# Patient Record
Sex: Female | Born: 1955 | Race: White | Hispanic: No | Smoking: Former smoker
Health system: Southern US, Community
[De-identification: ages and names within clinical notes are randomized; demographics above are authoritative.]

## PROBLEM LIST (undated history)

## (undated) DIAGNOSIS — Z87442 Personal history of urinary calculi: Secondary | ICD-10-CM

## (undated) DIAGNOSIS — Z9889 Other specified postprocedural states: Secondary | ICD-10-CM

## (undated) DIAGNOSIS — T8859XA Other complications of anesthesia, initial encounter: Secondary | ICD-10-CM

## (undated) DIAGNOSIS — R112 Nausea with vomiting, unspecified: Secondary | ICD-10-CM

## (undated) DIAGNOSIS — E039 Hypothyroidism, unspecified: Secondary | ICD-10-CM

## (undated) DIAGNOSIS — I10 Essential (primary) hypertension: Secondary | ICD-10-CM

## (undated) DIAGNOSIS — E119 Type 2 diabetes mellitus without complications: Secondary | ICD-10-CM

## (undated) DIAGNOSIS — Z8489 Family history of other specified conditions: Secondary | ICD-10-CM

## (undated) DIAGNOSIS — R0600 Dyspnea, unspecified: Secondary | ICD-10-CM

## (undated) HISTORY — PX: BREAST SURGERY: SHX581

## (undated) HISTORY — DX: Type 2 diabetes mellitus without complications: E11.9

## (undated) HISTORY — PX: BACK SURGERY: SHX140

## (undated) HISTORY — PX: EYE SURGERY: SHX253

---

## 1988-10-24 HISTORY — PX: BREAST ENHANCEMENT SURGERY: SHX7

## 2013-10-24 HISTORY — PX: APPENDECTOMY: SHX54

## 2015-10-25 HISTORY — PX: CHOLECYSTECTOMY: SHX55

## 2015-12-09 ENCOUNTER — Encounter: Payer: Self-pay | Admitting: "Endocrinology

## 2015-12-09 ENCOUNTER — Ambulatory Visit (INDEPENDENT_AMBULATORY_CARE_PROVIDER_SITE_OTHER): Payer: Medicare FFS | Admitting: "Endocrinology

## 2015-12-09 VITALS — BP 123/83 | HR 81 | Ht 64.5 in | Wt 164.0 lb

## 2015-12-09 DIAGNOSIS — E108 Type 1 diabetes mellitus with unspecified complications: Secondary | ICD-10-CM | POA: Diagnosis not present

## 2015-12-09 DIAGNOSIS — E1065 Type 1 diabetes mellitus with hyperglycemia: Secondary | ICD-10-CM | POA: Diagnosis not present

## 2015-12-09 DIAGNOSIS — IMO0002 Reserved for concepts with insufficient information to code with codable children: Secondary | ICD-10-CM

## 2015-12-09 DIAGNOSIS — E038 Other specified hypothyroidism: Secondary | ICD-10-CM

## 2015-12-09 DIAGNOSIS — E039 Hypothyroidism, unspecified: Secondary | ICD-10-CM | POA: Insufficient documentation

## 2015-12-09 MED ORDER — LOSARTAN POTASSIUM 25 MG PO TABS: 25.0000 mg | ORAL_TABLET | Freq: Every day | ORAL | Status: AC

## 2015-12-09 MED ORDER — INSULIN ASPART 100 UNIT/ML ~~LOC~~ SOLN: SUBCUTANEOUS | Status: DC

## 2015-12-09 MED ORDER — LEVOTHYROXINE SODIUM 200 MCG PO TABS: 200.0000 ug | ORAL_TABLET | Freq: Every day | ORAL | Status: DC

## 2015-12-09 NOTE — Progress Notes (Signed)
Subjective:    Patient ID: Kristy King, female    DOB: June 30, 1956. Patient is being seen in consultation for management of diabetes requested by  ADDIS,DANIEL, DO  Past Medical History  Diagnosis Date  . Diabetes mellitus, type II [facility])    Past Surgical History  Procedure Laterality Date  . Back surgery     Social History   Social History  . Marital Status: Unknown    Spouse Name: N/A  . Number of Children: N/A  . Years of Education: N/A   Social History Main Topics  . Smoking status: Former Games developer  . Smokeless tobacco: None  . Alcohol Use: No  . Drug Use: No  . Sexual Activity: Not Asked   Other Topics Concern  . None   Social History Narrative  . None   Outpatient Encounter Prescriptions as of 12/09/2015  Medication Sig  . citalopram (CELEXA) 20 MG tablet Take 20 mg by mouth daily.  . insulin aspart (NOVOLOG) 100 UNIT/ML injection Via pump to [place] upto 60 units a day  . levothyroxine (SYNTHROID, LEVOTHROID) 200 MCG tablet Take 1 tablet (200 mcg total) by mouth daily before breakfast.  . losartan (COZAAR) 25 MG tablet Take 1 tablet (25 mg total) by mouth daily.  Marland Kitchen zolpidem (AMBIEN) 5 MG tablet Take 5 mg by mouth at bedtime as needed for sleep.  . [DISCONTINUED] insulin aspart (NOVOLOG) 100 UNIT/ML injection Inject into the skin 3 (three) times daily before meals. Via pump  . [DISCONTINUED] levothyroxine (SYNTHROID, LEVOTHROID) 200 MCG tablet Take 200 mcg by mouth daily before breakfast.  . [DISCONTINUED] losartan (COZAAR) 25 MG tablet Take 25 mg by mouth daily.   No facility-administered encounter medications on file as of 12/09/2015.   ALLERGIES: Allergies  Allergen Reactions  . Sulfa Antibiotics    VACCINATION STATUS:  There is no immunization history on file for this patient.  Diabetes She presents for her initial diabetic visit. She has type 1 diabetes mellitus. Onset time: She was diagnosed at approximate age of 21 years. Her disease course has been  worsening. There are no hypoglycemic associated symptoms. Pertinent negatives for hypoglycemia include no confusion, headaches, pallor or seizures. Associated symptoms include fatigue, polydipsia and polyuria. Pertinent negatives for diabetes include no chest pain and no polyphagia. There are no hypoglycemic complications. Symptoms are worsening. Diabetic complications include retinopathy. Risk factors for coronary artery disease include diabetes mellitus, hypertension, sedentary lifestyle and tobacco exposure. Current diabetic treatment includes insulin pump (She has used insulin pump for "years ", did not bring the meter or log to review her readings. She claims she is monitoring blood glucose 8 times a day.). Compliance with diabetes treatment: Her most recent A1c is 8.2%. She is on NovoLog via insulin pump at complicated basal flow rate. See below. Her weight is fluctuating minimally. She is following a generally unhealthy diet. When asked about meal planning, she reported none. She has not had a previous visit with a dietitian. She participates in exercise intermittently. Her home blood glucose trend is fluctuating dramatically (Basal setting 12:00 =0.5, 4:30 =0.6, 8:00 =0.75, 9:30=0..85, 9:30=0.85, 12:00=1.1, 2:00=0.7, 4;00=0.45, 9;00=0.8 . This gives her total basal insulin of 15.85 units per 24 hours. She boluses for insulin to carb ratio of 10, insulin sensitivity factor 50,.). An ACE inhibitor/angiotensin II receptor blocker is being taken.  Hypertension This is a chronic problem. The current episode started more than 1 year ago. Pertinent negatives include no chest pain, headaches, palpitations or shortness of breath.  Past treatments include angiotensin blockers. Hypertensive end-organ damage includes retinopathy and a thyroid problem.  Thyroid Problem Presents for initial visit. Symptoms include fatigue. Patient reports no cold intolerance, diarrhea, heat intolerance or palpitations. The symptoms  have been stable. Past treatments include levothyroxine (She is currently on levothyroxine 200 g by mouth every morning.). The following procedures have not been performed: thyroid FNA and thyroidectomy.     Review of Systems  Constitutional: Positive for fatigue. Negative for fever, chills and unexpected weight change.  HENT: Negative for trouble swallowing and voice change.   Eyes: Negative for visual disturbance.  Respiratory: Negative for cough, shortness of breath and wheezing.   Cardiovascular: Negative for chest pain, palpitations and leg swelling.  Gastrointestinal: Negative for nausea, vomiting and diarrhea.  Endocrine: Positive for polydipsia and polyuria. Negative for cold intolerance, heat intolerance and polyphagia.  Musculoskeletal: Negative for myalgias and arthralgias.  Skin: Negative for color change, pallor, rash and wound.  Neurological: Negative for seizures and headaches.  Psychiatric/Behavioral: Negative for suicidal ideas and confusion.    Objective:    BP 123/83 mmHg  Pulse 81  Ht 5' 4.5" (1.638 m)  Wt 164 lb (74.39 kg)  BMI 27.73 kg/m2  SpO2 99%  Wt Readings from Last 3 Encounters:  12/09/15 164 lb (74.39 kg)    Physical Exam  Constitutional: She is oriented to person, place, and time. She appears well-developed.  HENT:  Head: Normocephalic and atraumatic.  Eyes: EOM are normal.  Neck: Normal range of motion. Neck supple. No tracheal deviation present. No thyromegaly present.  Cardiovascular: Normal rate and regular rhythm.   Pulmonary/Chest: Effort normal and breath sounds normal.  Abdominal: Soft. Bowel sounds are normal. There is no tenderness. There is no guarding.  Musculoskeletal: Normal range of motion. She exhibits no edema.  Neurological: She is alert and oriented to person, place, and time. She has normal reflexes. No cranial nerve deficit. Coordination normal.  Skin: Skin is warm and dry. No rash noted. No erythema. No pallor.   Psychiatric: She has a normal mood and affect. Judgment normal.  Defensive, suspicious affect.    Assessment & Plan:   1. Uncontrolled type 1 diabetes mellitus with complication [facility])   - Patient has currently uncontrolled symptomatic type 1 DM since  60 years of age,  with most recent A1c of 8.2% from September 2016.  Nor recent labs to review. She states she has a copy of her recent labs and she will bring it next visit with her.  -Despite her history of type 1 diabetes for 37 years, she denies any history of DKA. -Review of her insulin pump sitting suggests she may have a component of insulin resistance. This would warrant further workup to classify her diabetes properly. This will be done once she approaches target A1c of 7%.   Her diabetes is complicated by retinopathy and patient remains at a high risk for more acute and chronic complications of diabetes which include CAD, CVA, CKD, retinopathy, and neuropathy. These are all discussed in detail with the patient.  - I have counseled the patient on diet management  by adopting a carbohydrate modified/protein rich diet.  - Suggestion is made for patient to avoid simple carbohydrates   from their diet including Cakes , Desserts, Ice Cream,  Soda (  diet and regular) , Sweet Tea , Candies,  Chips, Cookies, Artificial Sweeteners,   and "Sugar-free" Products . This will help patient to have stable blood glucose profile and potentially avoid unintended  weight gain.  - I encouraged the patient to switch to  unprocessed or minimally processed complex starch and increased protein intake (animal or plant source), fruits, and vegetables.  - Patient is advised to stick to a routine mealtimes to eat 3 meals  a day and avoid unnecessary snacks ( to snack only to correct hypoglycemia).  - The patient will be scheduled with Norm Salt, RDN, CDE for individualized DM education.  - I have approached patient with the following individualized plan  to manage diabetes and patient agrees:   - I  have revised and edited her basal insulin flow rate as follows : 12 AM = 0.5, 8 AM =0.75, 12:30 PM =0.8, and 6:30 PM =0.6    -this will give her basal insulin of 15.5 units in 24 hours.  -Her bolus stating is allowed to run the same with insulin to carb ratio of 10, insulin sensitivity factor of 50, target between 100 140, and active insulin time 3 hours.  -Based on her current body weight her total daily requirement of insulin is calculated to be approximately 36 units. -She is advised to start  strict monitoring of glucose  AC and HS. - Patient is warned not to take insulin without proper monitoring per orders.  -Patient is encouraged to call clinic for blood glucose levels less than 70 or above 300 mg /dl.  -Patient is not a candidate for metformin,SGLT2 inhibitors, nor  incretin therapy .  - Patient specific target  A1c;  LDL, HDL, Triglycerides, and  Waist Circumference were discussed in detail.  2) BP/HTN:  controlled. Continue current medications including ACEI/ARB. 3) Lipids/HPL:   Controlled unknown, she is not on statins. 4)  Weight/Diet: CDE Consult will be initiated , exercise, and detailed carbohydrates information provided.  5. hypothyroidism - She appears clinically euthyroid. I will review her lab work which she states she had recently and would make adjustment in her thyroid hormone doses if necessary.  - We discussed about correct intake of levothyroxine, at fasting, with water, separated by at least 30 minutes from breakfast, and separated by more than 4 hours from calcium, iron, multivitamins, acid reflux medications (PPIs). -Patient is made aware of the fact that thyroid hormone replacement is needed for life, dose to be adjusted by periodic monitoring of thyroid function tests.  6) Chronic Care/Health Maintenance:  -Patient is on ARB and encouraged to continue to follow up with Ophthalmology, Podiatrist at least yearly or  according to recommendations, and advised to   stay away from smoking. I have recommended yearly flu vaccine and pneumonia vaccination at least every 5 years; moderate intensity exercise for up to 150 minutes weekly; and  sleep for at least 7 hours a day.  - 60 minutes of time was spent on the care of this patient , 50% of which was applied for counseling on diabetes complications and their preventions.  - Patient to bring meter and  blood glucose logs during their next visit.   - I advised patient to maintain close follow up with ADDIS,DANIEL, DO for primary care needs.  Follow up plan: - Return in about 1 week (around 12/16/2015) for diabetes, underactive thyroid, high blood pressure, follow up with meter and logs- no labs.  Marquis Lunch, MD Phone: [phone]  Fax: [phone]   12/09/2015, 3:23 PM

## 2015-12-17 ENCOUNTER — Ambulatory Visit: Payer: Medicare FFS | Admitting: "Endocrinology

## 2015-12-25 ENCOUNTER — Ambulatory Visit: Payer: Self-pay | Admitting: Nutrition

## 2016-01-26 ENCOUNTER — Other Ambulatory Visit: Payer: Self-pay | Admitting: "Endocrinology

## 2016-02-29 ENCOUNTER — Other Ambulatory Visit: Payer: Self-pay

## 2016-02-29 MED ORDER — INSULIN ASPART 100 UNIT/ML ~~LOC~~ SOLN: SUBCUTANEOUS | Status: DC

## 2021-06-21 ENCOUNTER — Encounter (HOSPITAL_BASED_OUTPATIENT_CLINIC_OR_DEPARTMENT_OTHER): Payer: Self-pay | Admitting: Urology

## 2021-06-21 ENCOUNTER — Other Ambulatory Visit: Payer: Self-pay | Admitting: Urology

## 2021-06-21 DIAGNOSIS — N200 Calculus of kidney: Secondary | ICD-10-CM

## 2021-06-21 NOTE — Progress Notes (Signed)
Patient to arrive at 0800 on 06/24/2021. History and medications reviewed. Pre-procedure instructions given. Patient has an insulin pump and will set at basal rate as instructed. NPO after MN on Wednesday except for clear liquids until 0600. Has stopped naproxen on 06/21/2021. Will take losartan AM of procedure. Driver secured.

## 2021-06-23 NOTE — H&P (Signed)
  H&P  Chief Complaint: Lt kidney stone  History of Present Illness: Kristy King is a 65 y.o. year old female presenting for ESL of a 7 mm Lt renal pelvic stone  Past Medical History:  Diagnosis Date   Diabetes mellitus, type II (HCC)    Dyspnea    History of kidney stones    Hypertension    Hypothyroidism       Home Medications:  No medications prior to admission.    Allergies:  Allergies  Allergen Reactions   Sulfa Antibiotics     No family history on file.  Social History:  reports that she has quit smoking. She does not have any smokeless tobacco history on file. She reports that she does not drink alcohol and does not use drugs.  ROS: A complete review of systems was performed.  All systems are negative except for pertinent findings as noted.  Physical Exam:  Vital signs in last 24 hours: BP: ()/()  Arterial Line BP: ()/()  General:  Alert and oriented, No acute distress HEENT: Normocephalic, atraumatic Neck: No JVD or lymphadenopathy Cardiovascular: Regular rate  Lungs: Normal inspiratory/expiratory excursion Extremities: No edema Neurologic: Grossly intact  CT/plain film images reviewed  Impression/Assessment:  Lt renal stone  Plan:  ESL  Bertram Millard Elinora Weigand 06/23/2021, 7:56 PM  Bertram Millard. Nishtha Raider MD

## 2021-06-24 ENCOUNTER — Encounter (HOSPITAL_BASED_OUTPATIENT_CLINIC_OR_DEPARTMENT_OTHER)
Admission: RE | Disposition: A | Discharge status: Home / Self Care | Payer: Self-pay | Source: Home / Self Care | Attending: Urology

## 2021-06-24 ENCOUNTER — Ambulatory Visit (HOSPITAL_BASED_OUTPATIENT_CLINIC_OR_DEPARTMENT_OTHER)
Admission: RE | Admit: 2021-06-24 | Discharge: 2021-06-24 | Disposition: A | Discharge status: Home / Self Care | Payer: Medicare PPO | Attending: Urology | Admitting: Urology

## 2021-06-24 ENCOUNTER — Other Ambulatory Visit: Payer: Self-pay

## 2021-06-24 ENCOUNTER — Ambulatory Visit (HOSPITAL_COMMUNITY): Payer: Medicare PPO

## 2021-06-24 ENCOUNTER — Encounter (HOSPITAL_BASED_OUTPATIENT_CLINIC_OR_DEPARTMENT_OTHER): Payer: Self-pay | Admitting: Urology

## 2021-06-24 DIAGNOSIS — Z87891 Personal history of nicotine dependence: Secondary | ICD-10-CM | POA: Insufficient documentation

## 2021-06-24 DIAGNOSIS — I10 Essential (primary) hypertension: Secondary | ICD-10-CM | POA: Diagnosis not present

## 2021-06-24 DIAGNOSIS — N200 Calculus of kidney: Secondary | ICD-10-CM | POA: Diagnosis not present

## 2021-06-24 DIAGNOSIS — Z882 Allergy status to sulfonamides status: Secondary | ICD-10-CM | POA: Diagnosis not present

## 2021-06-24 DIAGNOSIS — E119 Type 2 diabetes mellitus without complications: Secondary | ICD-10-CM | POA: Diagnosis not present

## 2021-06-24 DIAGNOSIS — E039 Hypothyroidism, unspecified: Secondary | ICD-10-CM | POA: Diagnosis not present

## 2021-06-24 HISTORY — DX: Hypothyroidism, unspecified: E03.9

## 2021-06-24 HISTORY — DX: Dyspnea, unspecified: R06.00

## 2021-06-24 HISTORY — DX: Other specified postprocedural states: Z98.890

## 2021-06-24 HISTORY — DX: Personal history of urinary calculi: Z87.442

## 2021-06-24 HISTORY — PX: EXTRACORPOREAL SHOCK WAVE LITHOTRIPSY: SHX1557

## 2021-06-24 HISTORY — DX: Other complications of anesthesia, initial encounter: T88.59XA

## 2021-06-24 HISTORY — DX: Family history of other specified conditions: Z84.89

## 2021-06-24 HISTORY — DX: Essential (primary) hypertension: I10

## 2021-06-24 HISTORY — DX: Nausea with vomiting, unspecified: R11.2

## 2021-06-24 LAB — GLUCOSE, CAPILLARY
Glucose-Capillary: 247 mg/dL — ABNORMAL HIGH (ref 70–99)
Glucose-Capillary: 182 mg/dL — ABNORMAL HIGH (ref 70–99)

## 2021-06-24 SURGERY — LITHOTRIPSY, ESWL
Anesthesia: LOCAL | Laterality: Left

## 2021-06-24 MED ORDER — KETOROLAC TROMETHAMINE 10 MG PO TABS: 10.0000 mg | ORAL_TABLET | Freq: Four times a day (QID) | ORAL | 0 refills | Status: DC | PRN

## 2021-06-24 MED ORDER — CIPROFLOXACIN HCL 500 MG PO TABS
ORAL_TABLET | ORAL | Status: AC
  Filled 2021-06-24: qty 1

## 2021-06-24 MED ORDER — DIPHENHYDRAMINE HCL 25 MG PO CAPS
25.0000 mg | ORAL_CAPSULE | ORAL | Status: AC
  Administered 2021-06-24: 25 mg via ORAL

## 2021-06-24 MED ORDER — DIAZEPAM 5 MG PO TABS
10.0000 mg | ORAL_TABLET | ORAL | Status: AC
  Administered 2021-06-24: 10 mg via ORAL

## 2021-06-24 MED ORDER — CIPROFLOXACIN HCL 500 MG PO TABS
500.0000 mg | ORAL_TABLET | ORAL | Status: AC
  Administered 2021-06-24: 500 mg via ORAL

## 2021-06-24 MED ORDER — DIAZEPAM 5 MG PO TABS
ORAL_TABLET | ORAL | Status: AC
  Filled 2021-06-24: qty 2

## 2021-06-24 MED ORDER — DIPHENHYDRAMINE HCL 25 MG PO CAPS
ORAL_CAPSULE | ORAL | Status: AC
  Filled 2021-06-24: qty 1

## 2021-06-24 MED ORDER — KETOROLAC TROMETHAMINE 60 MG/2ML IM SOLN: 15.0000 mg | Freq: Once | INTRAMUSCULAR | Status: AC

## 2021-06-24 MED ORDER — SODIUM CHLORIDE 0.9 % IV SOLN: INTRAVENOUS | Status: DC

## 2021-06-24 NOTE — Op Note (Signed)
See [facility] OP note scanned into chart. 

## 2021-06-24 NOTE — Discharge Instructions (Addendum)
See [facility] discharge instructions in chart.  

## 2021-06-24 NOTE — Interval H&P Note (Signed)
History and Physical Interval Note:  06/24/2021 9:52 AM  Kristy King  has presented today for surgery, with the diagnosis of LEFT RENAL STONE.  The various methods of treatment have been discussed with the patient and family. After consideration of risks, benefits and other options for treatment, the patient has consented to  Procedure(s): EXTRACORPOREAL SHOCK WAVE LITHOTRIPSY (ESWL) (Left) as a surgical intervention.  The patient's history has been reviewed, patient examined, no change in status, stable for surgery.  I have reviewed the patient's chart and labs.  Questions were answered to the patient's satisfaction.     Bertram Millard Redell Nazir

## 2021-06-25 ENCOUNTER — Encounter (HOSPITAL_BASED_OUTPATIENT_CLINIC_OR_DEPARTMENT_OTHER): Payer: Self-pay | Admitting: Urology

## 2021-11-29 ENCOUNTER — Encounter: Payer: Self-pay | Admitting: Endocrinology

## 2021-11-29 ENCOUNTER — Other Ambulatory Visit: Payer: Self-pay

## 2021-11-29 ENCOUNTER — Ambulatory Visit (INDEPENDENT_AMBULATORY_CARE_PROVIDER_SITE_OTHER): Payer: Medicare PPO | Admitting: Endocrinology

## 2021-11-29 VITALS — BP 150/74 | HR 76 | Ht 65.0 in | Wt 204.0 lb

## 2021-11-29 DIAGNOSIS — E10319 Type 1 diabetes mellitus with unspecified diabetic retinopathy without macular edema: Secondary | ICD-10-CM | POA: Diagnosis not present

## 2021-11-29 DIAGNOSIS — E109 Type 1 diabetes mellitus without complications: Secondary | ICD-10-CM

## 2021-11-29 LAB — POCT GLYCOSYLATED HEMOGLOBIN (HGB A1C): Hemoglobin A1C: 7.5 % — AB (ref 4.0–5.6)

## 2021-11-29 NOTE — Patient Instructions (Addendum)
good diet and exercise significantly improve the control of your diabetes.  please let me know if you wish to be referred to a dietician.  high blood sugar is very risky to your health.  you should see an eye doctor and dentist every year.  It is very important to get all recommended vaccinations.  Controlling your blood pressure and cholesterol drastically reduces the damage diabetes does to your body.  Those who smoke should quit.  Please discuss these with your doctor.  check your blood sugar 4 times a day: before the 3 meals, and at bedtime.  also check if you have symptoms of your blood sugar being too high or too low.  please keep a record of the readings and bring it to your next appointment here (or you can bring the meter itself).  You can write it on any piece of paper.  please call us sooner if your blood sugar goes below 70, or if most of your readings are over 200.   We will need to take this complex situation in stages. For now, please do not take any boluses after supper.   We are placing a continuous glucose monitor sensor today.   Please come back for a follow-up appointment in 2 weeks Please see a pump trainer the same day, to consider a new pump and continuous glucose monitor.

## 2021-11-29 NOTE — Progress Notes (Signed)
Subjective:    Patient ID: Kristy King, female    DOB: 07-08-1956, 66 y.o.   MRN: [id number]  HPI pt is referred by Dr Marveen Reeks, for diabetes.  Pt states DM was dx'ed in [id number]; it is complicated by DR; she has been on insulin since dx; pt says her diet and exercise are not good; she has never had pancreatitis, pancreatic surgery, severe hypoglycemia.  She has had DKA once (2018).  She has mild hypoglycemia every night, 2200-0230.  She eats meals at 10AM, 1PM, and 5PM.  She takes basal rates varying from 0-1.75  units/hr.  Bolus is 1 unit/10 grams CHO, and ISR of 1 unit/50 over 120.  Total daily dosage is 44 units (54% basal).  She takes 5.5 boluses per day (often after supper).  She has never used continuous glucose monitor Past Medical History:  Diagnosis Date   Complication of anesthesia    Diabetes mellitus, type II ([facility])    Dyspnea    Family history of adverse reaction to anesthesia    History of kidney stones    Hypertension    Hypothyroidism    PONV (postoperative nausea and vomiting)     Past Surgical History:  Procedure Laterality Date   APPENDECTOMY N/A 2015   BACK SURGERY     BREAST ENHANCEMENT [place]   CHOLECYSTECTOMY N/A 2017   EXTRACORPOREAL SHOCK WAVE LITHOTRIPSY Left 06/24/2021   Procedure: EXTRACORPOREAL SHOCK WAVE LITHOTRIPSY (ESWL);  Surgeon: Franchot Gallo, MD;  Location: [facility];  Service: Urology;  Laterality: Left;   EYE SURGERY      Social History   Socioeconomic History   Marital status: Unknown    Spouse name: Not on file   Number of children: Not on file   Years of education: Not on file   Highest education level: Not on file  Occupational History   Not on file  Tobacco Use   Smoking status: Former   Smokeless tobacco: Never  Substance and Sexual Activity   Alcohol use: No    Alcohol/week: 0.0 standard drinks   Drug use: No   Sexual activity: Not on file  Other Topics  Concern   Not on file  Social History Narrative   Not on file   Social Determinants of Health   Financial Resource Strain: Not on file  Food Insecurity: Not on file  Transportation Needs: Not on file  Physical Activity: Not on file  Stress: Not on file  Social Connections: Not on file  Intimate Partner Violence: Not on file      Allergies  Allergen Reactions   Sulfa Antibiotics     Family History  Problem Relation Age of Onset   Diabetes Neg Hx     BP (!) 150/74    Pulse 76    Ht 5\' 5"  (1.651 m)    Wt 204 lb (92.5 kg)    SpO2 97%    BMI 33.95 kg/m     Review of Systems denies sob, n/v, memory loss.  She has gained 40 lbs x 3 years.     Objective:   Physical Exam VITAL SIGNS:  See vs page GENERAL: no distress Pulses: dorsalis pedis intact bilat.   MSK: no deformity of the feet CV: no leg edema Skin:  no ulcer on the feet.  normal color and temp on the feet. Neuro: sensation is intact to touch on the feet Ext:  there is bilateral onychomycosis of the toenails.    I have reviewed outside records, and summarized: Pt was noted to have elevated A1c, and referred here.  She was noted to have widely varying glucose and frequent hypoglycemia   A1c=7.5%    Assessment & Plan:  Type 1 DM: uncontrolled Hypoglycemia, due to insulin.  We discussed causes of this in the evening.  Patient Instructions  good diet and exercise significantly improve the control of your diabetes.  please let me know if you wish to be referred to a dietician.  high blood sugar is very risky to your health.  you should see an eye doctor and dentist every year.  It is very important to get all recommended vaccinations.  Controlling your blood pressure and cholesterol drastically reduces the damage diabetes does to your body.  Those who smoke should quit.  Please discuss these with your doctor.  check your blood sugar 4 times a day: before the 3 meals, and at bedtime.  also check if you have  symptoms of your blood sugar being too high or too low.  please keep a record of the readings and bring it to your next appointment here (or you can bring the meter itself).  You can write it on any piece of paper.  please call us sooner if your blood sugar goes below 70, or if most of your readings are over 200.   We will need to take this complex situation in stages. For now, please do not take any boluses after supper.   We are placing a continuous glucose monitor sensor today.   Please come back for a follow-up appointment in 2 weeks Please see a pump trainer the same day, to consider a new pump and continuous glucose monitor.

## 2021-11-30 ENCOUNTER — Telehealth: Payer: Self-pay

## 2021-11-30 ENCOUNTER — Other Ambulatory Visit: Payer: Self-pay | Admitting: Endocrinology

## 2021-11-30 MED ORDER — INSULIN ASPART 100 UNIT/ML IJ SOLN: INTRAMUSCULAR | 3 refills | Status: DC

## 2021-11-30 NOTE — Telephone Encounter (Signed)
Pt Lvm and wanted to know f you sent her insulin in to [facility] in [place]. I did not see anything in the medication list that was sent in.  Please Advise

## 2021-12-01 DIAGNOSIS — E119 Type 2 diabetes mellitus without complications: Secondary | ICD-10-CM | POA: Insufficient documentation

## 2021-12-14 ENCOUNTER — Ambulatory Visit: Payer: Medicare PPO | Admitting: Endocrinology

## 2021-12-14 ENCOUNTER — Other Ambulatory Visit: Payer: Self-pay

## 2021-12-14 VITALS — BP 140/70 | HR 83 | Ht 65.0 in | Wt 201.6 lb

## 2021-12-14 DIAGNOSIS — E10319 Type 1 diabetes mellitus with unspecified diabetic retinopathy without macular edema: Secondary | ICD-10-CM | POA: Diagnosis not present

## 2021-12-14 MED ORDER — FREESTYLE LIBRE 2 SENSOR MISC: 1.0000 | 3 refills | Status: DC

## 2021-12-14 NOTE — Patient Instructions (Addendum)
check your blood sugar 4 times a day: before the 3 meals, and at bedtime.  also check if you have symptoms of your blood sugar being too high or too low.  please keep a record of the readings and bring it to your next appointment here (or you can bring the meter itself).  You can write it on any piece of paper.  please call us sooner if your blood sugar goes below 70, or if most of your readings are over 200.   We will need to take this complex situation in stages.  Please take these settings:  basal rate: 1AM 0.5 units/HR 6AM 1 unit/HR 9PM 0.05 units/HR.  bolus of 1 unit/10 grams carbohydrate.   correction bolus (which some people call "sensitivity," or "insulin sensitivity ratio," or just "isr") of 1 unit for each 50 by which your glucose exceeds 120.   Please come back for a follow-up appointment in 1 month.

## 2021-12-14 NOTE — Progress Notes (Signed)
Subjective:    Patient ID: Kristy King, female    DOB: 12-26-1955, 66 y.o.   MRN: [id number]  HPI Pt returns for f/u of diabetes mellitus: DM type: 1 Dx'ed: 1979 Complications: DR Therapy: insulin since dx GDM: never DKA: 2018 Severe hypoglycemia: never Pancreatitis: never Pancreatic imaging: none known SDOH: none Other: she uses pump and continuous glucose monitor Interval history: She eats meals at 9AM, 1PM, and 6PM.  She also eats later in the evening.  She takes these settings:  Multiple basal rates, varying from 0-1.75 units/hr bolus of 1 unit/10 grams carbohydrate correction bolus (which some people call "sensitivity," or "insulin sensitivity ratio," or just "isr") of 1 unit for each 50 by which your glucose exceeds 120.  She has mild hypoglycemia approx BIW I reviewed continuous glucose monitor data.  Glucose varies from 60-400.  It is in general highest at 6AM-10AM, then it steadily decreases to 1AM.  Then it re-increases Past Medical History:  Diagnosis Date   Complication of anesthesia    Diabetes mellitus, type II (HCC)    Dyspnea    Family history of adverse reaction to anesthesia    History of kidney stones    Hypertension    Hypothyroidism    PONV (postoperative nausea and vomiting)     Past Surgical History:  Procedure Laterality Date   APPENDECTOMY N/A 2015   BACK SURGERY     BREAST ENHANCEMENT SURGERY  1990   BREAST SURGERY     CESAREAN SECTION  1982   CHOLECYSTECTOMY N/A 2017   EXTRACORPOREAL SHOCK WAVE LITHOTRIPSY Left 06/24/2021   Procedure: EXTRACORPOREAL SHOCK WAVE LITHOTRIPSY (ESWL);  Surgeon: Marcine Matar, MD;  Location: [facility];  Service: Urology;  Laterality: Left;   EYE SURGERY      Social History   Socioeconomic History   Marital status: Unknown    Spouse name: Not on file   Number of children: Not on file   Years of education: Not on file   Highest education level: Not on file  Occupational History   Not on  file  Tobacco Use   Smoking status: Former   Smokeless tobacco: Never  Substance and Sexual Activity   Alcohol use: No    Alcohol/week: 0.0 standard drinks   Drug use: No   Sexual activity: Not on file  Other Topics Concern   Not on file  Social History Narrative   Not on file   Social Determinants of Health   Financial Resource Strain: Not on file  Food Insecurity: Not on file  Transportation Needs: Not on file  Physical Activity: Not on file  Stress: Not on file  Social Connections: Not on file  Intimate Partner Violence: Not on file      Allergies  Allergen Reactions   Sulfa Antibiotics     Family History  Problem Relation Age of Onset   Diabetes Neg Hx     BP 140/70    Pulse 83    Ht 5\' 5"  (1.651 m)    Wt 201 lb 9.6 oz (91.4 kg)    SpO2 97%    BMI 33.55 kg/m   Review of Systems     Objective:   Physical Exam    Lab Results  Component Value Date   HGBA1C 7.5 (A) 11/29/2021      Assessment & Plan:  Type 1 DM. Hypoglycemia, due to insulin: this limits aggressiveness of glycemic control  Patient Instructions  check your blood sugar 4 times  a day: before the 3 meals, and at bedtime.  also check if you have symptoms of your blood sugar being too high or too low.  please keep a record of the readings and bring it to your next appointment here (or you can bring the meter itself).  You can write it on any piece of paper.  please call us sooner if your blood sugar goes below 70, or if most of your readings are over 200.   We will need to take this complex situation in stages.  Please take these settings:  basal rate: 1AM 0.5 units/HR 6AM 1 unit/HR 9PM 0.05 units/HR.  bolus of 1 unit/10 grams carbohydrate.   correction bolus (which some people call "sensitivity," or "insulin sensitivity ratio," or just "isr") of 1 unit for each 50 by which your glucose exceeds 120.   Please come back for a follow-up appointment in 1 month.

## 2021-12-17 ENCOUNTER — Encounter: Payer: Self-pay | Admitting: Endocrinology

## 2021-12-28 ENCOUNTER — Telehealth: Payer: Self-pay | Admitting: Nutrition

## 2021-12-28 NOTE — Telephone Encounter (Signed)
Message left on my machine to call her with help with CGM.  LVM to call me to discuss cgms and training for this. ?

## 2022-01-13 ENCOUNTER — Ambulatory Visit: Payer: Medicare PPO | Admitting: Endocrinology

## 2022-02-02 ENCOUNTER — Telehealth: Payer: Self-pay | Admitting: Nutrition

## 2022-02-02 NOTE — Telephone Encounter (Signed)
Patient reports that she still has not gotten her pump supplies from [facility].  She called them today, and they told her they requested last office notes, but only received the fax cover sheet.  She has only 5 days left of her pump supplies ?This note sent to Amieya ?

## 2022-02-02 NOTE — Telephone Encounter (Signed)
Patient says she needs another appointment in one month with a doctor in order to continue getting her pump supplies.  She is a patient of Dr. Loanne Drilling.  Please call her to work her into someone's schedule, so she can get her insulin supplies. Wayman Hoard at the front desk says she can not make an appointment for him.  Not sure why.  Please call patient.  She is getting very anxious ?

## 2022-02-07 ENCOUNTER — Telehealth: Payer: Self-pay

## 2022-02-07 ENCOUNTER — Encounter: Payer: Medicare PPO | Attending: Endocrinology | Admitting: Nutrition

## 2022-02-07 DIAGNOSIS — E109 Type 1 diabetes mellitus without complications: Secondary | ICD-10-CM | POA: Diagnosis present

## 2022-02-07 DIAGNOSIS — Z713 Dietary counseling and surveillance: Secondary | ICD-10-CM | POA: Diagnosis not present

## 2022-02-07 DIAGNOSIS — E10319 Type 1 diabetes mellitus with unspecified diabetic retinopathy without macular edema: Secondary | ICD-10-CM

## 2022-02-07 NOTE — Patient Instructions (Addendum)
Change sensor every 14 days ?Read over manual and call help line if questions or if sensor falls off. ?

## 2022-02-07 NOTE — Telephone Encounter (Signed)
Physician order has now been faxed over to [facility].  ? ?Faxed to: [phone] ?

## 2022-02-07 NOTE — Progress Notes (Signed)
Patient reports that she still does not have her pump supplies.  She again called [facility], and they said the form was not filled out completes, missing the number of infusion sets needed.  Kristy King was notified and she was shown the form and what needed to be checked off.  This was faxed, and patient was notifed.  ?She was trained on how to use the [place] 2.  Her phone could not download the app, so readings are going to her reader.  This was set with date/time and she was show how to attach the sensor and start it using the reader.  She reported good understanding of this and had no final questions. ?Pt. Very upset that she will need an appointment with an endocrinologist in 2 months, in order to get her pump supplies.  There are no appointments remaining with Dr. Everardo All, and she does not know what to do. Pt. Was told that I will contact our office manager about this,  and she will be notified of this situation. ?EMail sent to Synetta Shadow ?Box of pump infusion sets given to patient to help with above situation ?

## 2022-02-09 ENCOUNTER — Encounter: Payer: Self-pay | Admitting: Endocrinology

## 2022-02-09 ENCOUNTER — Ambulatory Visit: Payer: Medicare PPO | Admitting: Endocrinology

## 2022-02-09 VITALS — BP 130/80 | HR 81 | Ht 65.0 in | Wt 204.2 lb

## 2022-02-09 DIAGNOSIS — E10319 Type 1 diabetes mellitus with unspecified diabetic retinopathy without macular edema: Secondary | ICD-10-CM

## 2022-02-09 DIAGNOSIS — E1065 Type 1 diabetes mellitus with hyperglycemia: Secondary | ICD-10-CM

## 2022-02-09 LAB — POCT GLYCOSYLATED HEMOGLOBIN (HGB A1C): Hemoglobin A1C: 8.1 % — AB (ref 4.0–5.6)

## 2022-02-09 NOTE — Telephone Encounter (Signed)
Opened in error

## 2022-02-09 NOTE — Patient Instructions (Addendum)
check your blood sugar 4 times a day: before the 3 meals, and at bedtime.  also check if you have symptoms of your blood sugar being too high or too low.  please keep a record of the readings and bring it to your next appointment here (or you can bring the meter itself).  You can write it on any piece of paper.  please call us sooner if your blood sugar goes below 70, or if most of your readings are over 200.   ?We will need to take this complex situation in stages.   ?Please take these settings:  ?Same 6 basal rates ?bolus of 1 unit/10 grams carbohydrate.   ?correction bolus (which some people call "sensitivity," or "insulin sensitivity ratio," or just "isr") of 1 unit for each 50 by which your glucose exceeds 120.   ?You should have an endocrinology follow-up appointment in 2-3 months.   ? ?

## 2022-02-09 NOTE — Progress Notes (Signed)
? ?Subjective:  ? ? Patient ID: Kristy King, female    DOB: [birth year], 66 y.o.   MRN: [id number] ? ?HPI ?Pt returns for f/u of diabetes mellitus: ?DM type: 1 ?Dx'ed: 1979 ?Complications: DR ?Therapy: insulin since dx ?GDM: never ?DKA: 2018 ?Severe hypoglycemia: never ?Pancreatitis: never ?Pancreatic imaging: none known ?SDOH: none ?Other: she uses pump and continuous glucose monitor ?Interval history: She eats meals at 9AM, 1PM, and 6PM.  She also eats later in the evening.  She takes these settings:  ?6 basal rates, varying from 0.025-2 units/hr.   ?bolus of 1 unit/10 grams carbohydrate ?correction bolus (which some people call "sensitivity," or "insulin sensitivity ratio," or just "isr") of 1 unit for each 50 by which your glucose exceeds 120.  She has mild hypoglycemia approx BIW ?I reviewed continuous glucose monitor printout, but data are minimal.  TDD is 42 units (51% basal) ?She has been using [place] CGM 3 days ago.  Since then, no hypoglycemia.  She does not want to reduce number of basal rates.   ?Past Medical History:  ?Diagnosis Date  ? Complication of anesthesia   ? Diabetes mellitus, type II (HCC)   ? Dyspnea   ? Family history of adverse reaction to anesthesia   ? History of kidney stones   ? Hypertension   ? Hypothyroidism   ? PONV (postoperative nausea and vomiting)   ? ? ?Past Surgical History:  ?Procedure Laterality Date  ? APPENDECTOMY N/A 2015  ? BACK SURGERY    ? BREAST ENHANCEMENT SURGERY  1990  ? BREAST SURGERY    ? CESAREAN SECTION  1982  ? CHOLECYSTECTOMY N/A 2017  ? EXTRACORPOREAL SHOCK WAVE LITHOTRIPSY Left 06/24/2021  ? Procedure: EXTRACORPOREAL SHOCK WAVE LITHOTRIPSY (ESWL);  Surgeon: Marcine Matar, MD;  Location[facility];  Service: Urology;  Laterality: Left;  ? EYE SURGERY    ? ? ?Social History  ? ?Socioeconomic History  ? Marital status: Unknown  ?  Spouse name: Not on file  ? Number of children: Not on file  ? Years of education: Not on file  ? Highest education  level: Not on file  ?Occupational History  ? Not on file  ?Tobacco Use  ? Smoking status: Former  ? Smokeless tobacco: Never  ?Substance and Sexual Activity  ? Alcohol use: No  ?  Alcohol/week: 0.0 standard drinks  ? Drug use: No  ? Sexual activity: Not on file  ?Other Topics Concern  ? Not on file  ?Social History Narrative  ? Not on file  ? ?Social Determinants of Health  ? ?Financial Resource Strain: Not on file  ?Food Insecurity: Not on file  ?Transportation Needs: Not on file  ?Physical Activity: Not on file  ?Stress: Not on file  ?Social Connections: Not on file  ?Intimate Partner Violence: Not on file  ? ? ? ? ?Allergies  ?Allergen Reactions  ? Sulfa Antibiotics   ? ? ?Family History  ?Problem Relation Age of Onset  ? Diabetes Neg Hx   ? ? ?BP 130/80 (BP Location: Right Arm, Patient Position: Sitting, Cuff Size: Normal)   Pulse 81   Ht 5\' 5"  (1.651 m)   Wt 204 lb 3.2 oz (92.6 kg)   SpO2 95%   BMI 33.98 kg/m?  ? ? ?Review of Systems ? ?   ?Objective:  ? Physical Exam ?VITAL SIGNS:  See vs page ?GENERAL: no distress ? ? ? ?Lab Results  ?Component Value Date  ? HGBA1C 8.1 (A) 02/09/2022  ? ?   ?  Assessment & Plan:  ?Type 1 DM: uncontrolled ? ? ?Patient Instructions  ?check your blood sugar 4 times a day: before the 3 meals, and at bedtime.  also check if you have symptoms of your blood sugar being too high or too low.  please keep a record of the readings and bring it to your next appointment here (or you can bring the meter itself).  You can write it on any piece of paper.  please call us sooner if your blood sugar goes below 70, or if most of your readings are over 200.   ?We will need to take this complex situation in stages.   ?Please take these settings:  ?Same 6 basal rates ?bolus of 1 unit/10 grams carbohydrate.   ?correction bolus (which some people call "sensitivity," or "insulin sensitivity ratio," or just "isr") of 1 unit for each 50 by which your glucose exceeds 120.   ?You should have an  endocrinology follow-up appointment in 2-3 months.   ? ? ? ?

## 2022-05-09 ENCOUNTER — Encounter: Payer: Medicare PPO | Attending: Internal Medicine | Admitting: Nutrition

## 2022-05-09 DIAGNOSIS — E10311 Type 1 diabetes mellitus with unspecified diabetic retinopathy with macular edema: Secondary | ICD-10-CM | POA: Diagnosis not present

## 2022-05-09 DIAGNOSIS — Z713 Dietary counseling and surveillance: Secondary | ICD-10-CM | POA: Diagnosis not present

## 2022-05-19 NOTE — Progress Notes (Signed)
Patient is here to review her diet and  blood sugar review Diet is not consistant and timing, carbs eaten, or fats.  Fat content varies considerably for meal to meal, and patient is not bolusing extra insulin for higher fat meals-causing high blood sugar reading 4-7 hours later.  She was shown how when to do an extended bolus, by adding 1-2 extra units of insulin to her meal, and delivering at least 50% of the bolus over 4 hours, to cover high blood sugar that results several hours after the meal.  Pt. Very appreciative of this information, and feels that this will help her with highs resulting from most of her PM and half of her lunch meals.  She had no final questions.

## 2022-05-19 NOTE — Patient Instructions (Signed)
When eating more than 1 high fat item in your meal, add 1-2 extra units of insulin to the bolus total, and give at least 50 percent of that bolus over 4 hours.   REad pump manuel for this option in your pump.

## 2022-10-24 HISTORY — PX: CARDIAC SURGERY: SHX584

## 2022-12-21 ENCOUNTER — Telehealth: Payer: Self-pay

## 2022-12-21 NOTE — Telephone Encounter (Signed)
Contact patient to schedule for appointment. Patient has not been seen since 4/23 and that was by Dr. Loanne Drilling. Paperwork received for CGM supplies. Needs appointment to complete.

## 2022-12-21 NOTE — Telephone Encounter (Signed)
Patient states that the paperwork was sent to the wrong endcrinology office.  Patient states that she will not be coming here.

## 2023-01-27 IMAGING — DX DG ABDOMEN 1V
2 series · 2 of 2 positions shown · non-contrast
Comparison: 06/18/2021, CT 06/05/2021

CLINICAL DATA: Preop lithotripsy

EXAM:
ABDOMEN - 1 VIEW

[abdomen kub (1 of 2)]
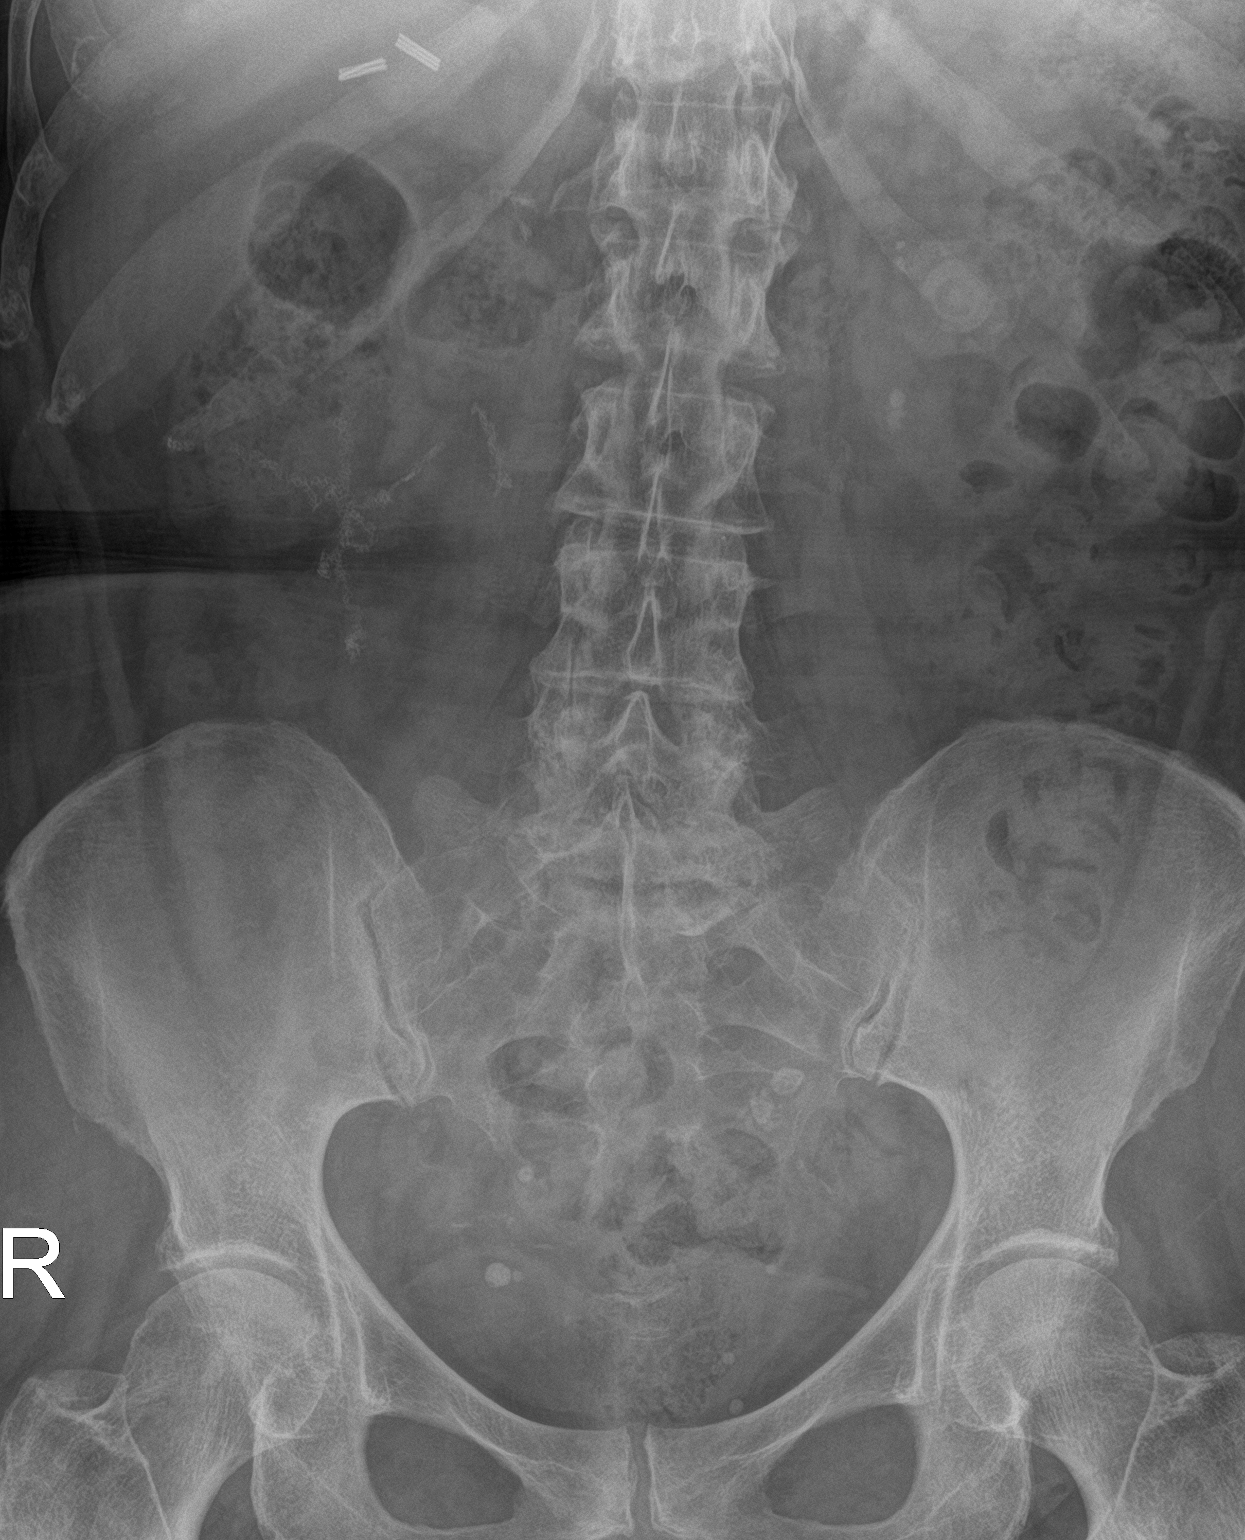

[abdomen kub (2 of 2)]
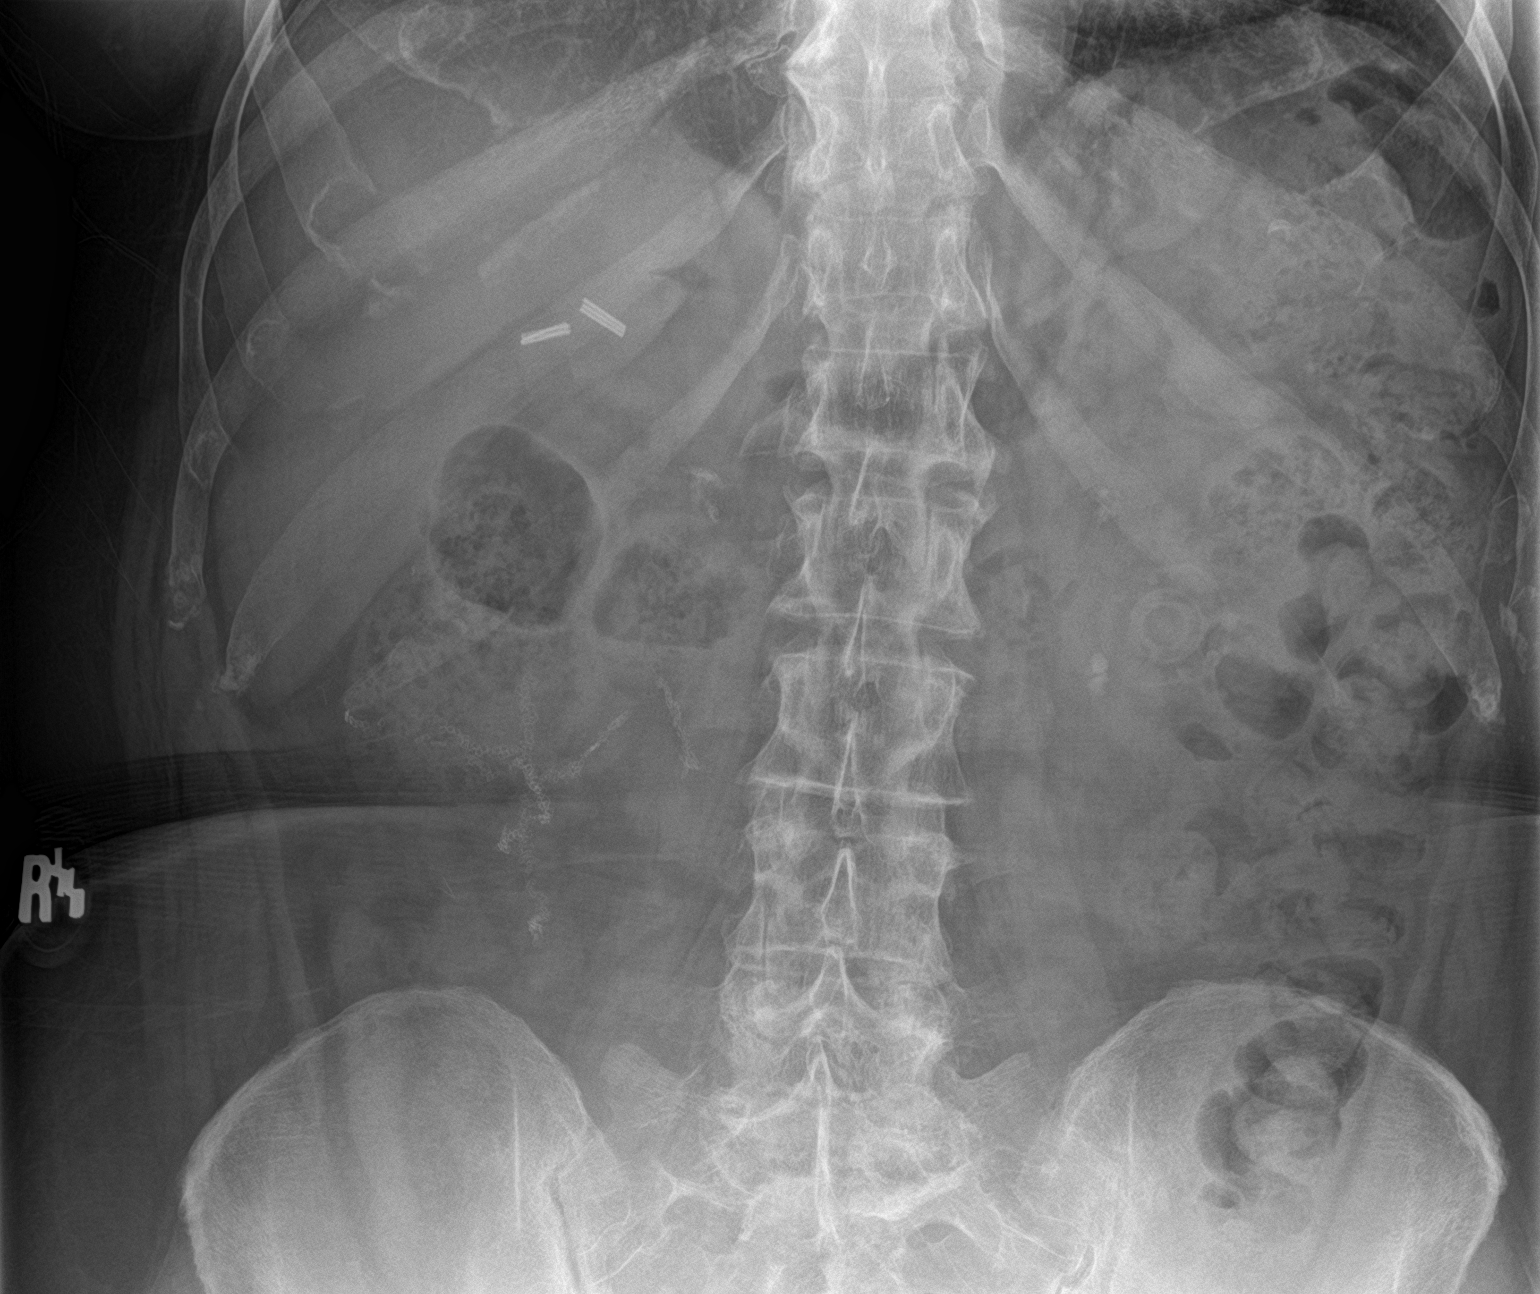

[2 of 2 positions shown; findings below may reference images not displayed]

FINDINGS: Nonobstructed gas pattern with moderate stool. Postsurgical changes
in the right upper quadrant. Multiple left-sided kidney stones, 2
adjacent stones measuring up to 8 mm aggregate to the left of L2-L3
presumably corresponds to CT demonstrated pelvic kidney stones.
There are multiple pelvic calcifications, some of which appear to
correspond to adnexal calcification and calcified uterine fibroids.
IMPRESSION: 1. Multiple left kidney stones
2. Nonobstructed gas pattern

## 2023-03-27 ENCOUNTER — Encounter: Payer: Self-pay | Admitting: Internal Medicine

## 2023-03-27 ENCOUNTER — Ambulatory Visit: Payer: Medicare HMO | Attending: Internal Medicine | Admitting: Internal Medicine

## 2023-03-27 VITALS — BP 124/64 | HR 84 | Ht 65.0 in | Wt 195.4 lb

## 2023-03-27 DIAGNOSIS — E7849 Other hyperlipidemia: Secondary | ICD-10-CM

## 2023-03-27 DIAGNOSIS — R0609 Other forms of dyspnea: Secondary | ICD-10-CM | POA: Diagnosis not present

## 2023-03-27 DIAGNOSIS — I10 Essential (primary) hypertension: Secondary | ICD-10-CM | POA: Diagnosis not present

## 2023-03-27 DIAGNOSIS — R0600 Dyspnea, unspecified: Secondary | ICD-10-CM | POA: Diagnosis not present

## 2023-03-27 DIAGNOSIS — R079 Chest pain, unspecified: Secondary | ICD-10-CM

## 2023-03-27 DIAGNOSIS — E785 Hyperlipidemia, unspecified: Secondary | ICD-10-CM | POA: Insufficient documentation

## 2023-03-27 MED ORDER — METOPROLOL TARTRATE 100 MG PO TABS: 100.0000 mg | ORAL_TABLET | Freq: Once | ORAL | 3 refills | Status: DC

## 2023-03-27 NOTE — Patient Instructions (Addendum)
Medication Instructions:  Your physician recommends that you continue on your current medications as directed. Please refer to the Current Medication list given to you today.   Labwork: BMET at [facility]  Testing/Procedures: Your physician has requested that you have an echocardiogram. Echocardiography is a painless test that uses sound waves to create images of your heart. It provides your doctor with information about the size and shape of your heart and how well your heart's chambers and valves are working. This procedure takes approximately one hour. There are no restrictions for this procedure. Please do NOT wear cologne, perfume, aftershave, or lotions (deodorant is allowed). Please arrive 15 minutes prior to your appointment time.  Non-Cardiac CT Angiography (CTA), is a special type of CT scan that uses a computer to produce multi-dimensional views of major blood vessels throughout the body. In CT angiography, a contrast material is injected through an IV to help visualize the blood vessels   Follow-Up: Your physician recommends that you schedule a follow-up appointment in: Pending  Any Other Special Instructions Will Be Listed Below (If Applicable).    Your cardiac CT will be scheduled at one of the below locations:   [facility] [address] [place], [place] [address] [phone]  If scheduled at [facility], please arrive at the [facility] and Children's Entrance (Entrance C2) of [facility] 30 minutes prior to test start time. You can use the FREE valet parking offered at entrance C (encouraged to control the heart rate for the test)  Proceed to the [facility] Radiology Department (first floor) to check-in and test prep.  All radiology patients and guests should use entrance C2 at [facility], accessed from [facility], even though the hospital's physical address listed is [address]    If scheduled at  [facility] or [place], please arrive 15 mins early for check-in and test prep.   On the Night Before the Test: Be sure to Drink plenty of water. Do not consume any caffeinated/decaffeinated beverages or chocolate 12 hours prior to your test. Do not take any antihistamines 12 hours prior to your test. If the patient has contrast allergy: No allergy to contrast   On the Day of the Test: Drink plenty of water until 1 hour prior to the test. Do not eat any food 1 hour prior to test. You may take your regular medications prior to the test.  Take metoprolol 100 mg(Lopressor) two hours prior to test. FEMALES- please wear underwire-free bra if available, avoid dresses & tight clothing       After the Test: Drink plenty of water. After receiving IV contrast, you may experience a mild flushed feeling. This is normal. On occasion, you may experience a mild rash up to 24 hours after the test. This is not dangerous. If this occurs, you can take Benadryl 25 mg and increase your fluid intake. If you experience trouble breathing, this can be serious. If it is severe call 911 IMMEDIATELY. If it is mild, please call our office.  We will call to schedule your test 2-4 weeks out understanding that some insurance companies will need an authorization prior to the service being performed.   For non-scheduling related questions, please contact the cardiac imaging nurse navigator should you have any questions/concerns: Rockwell Alexandria, Cardiac Imaging Nurse Navigator Larey Brick, Cardiac Imaging Nurse Navigator [facility] Direct Office Dial: [phone]   For scheduling needs,  including cancellations and rescheduling, please call [place], [phone].  If you need a refill on your cardiac medications before your next appointment, please call your pharmacy.

## 2023-03-27 NOTE — Progress Notes (Signed)
Cardiology Office Note  Date: 03/27/2023   ID: Kristy King, DOB 1956-03-04, MRN [id number]  PCP:  Renaldo Harrison, DO  Cardiologist:  None Electrophysiologist:  None   Reason for Office Visit: DOE evaluation   History of Present Illness: Kristy King is a 67 y.o. female known to have insulin-dependent diabetes mellitus type 1 on insulin pump, hypothyroidism, HLD was referred to cardiology clinic for evaluation of DOE.  Patient has insulin-dependent diabetes mellitus type 1 on insulin pump and also has complications of retinopathy, neuropathy etc. patient had COVID in 09/2022 after which she started to have DOE.  She also started to have chest pains, at rest and exertion, sharp and also substernal chest pressure lasting for a few seconds, intermittent happens multiple times throughout the week.  This chest pain started in 10/2022 and ongoing.  Has some dizziness when she bends down but otherwise no presyncope or syncope.  No palpitations, no leg swelling.  Denies smoking cigarettes.  Past Medical History:  Diagnosis Date   Complication of anesthesia    Diabetes mellitus, type II (HCC)    Dyspnea    Family history of adverse reaction to anesthesia    History of kidney stones    Hypertension    Hypothyroidism    PONV (postoperative nausea and vomiting)     Past Surgical History:  Procedure Laterality Date   APPENDECTOMY N/A 2015   BACK SURGERY     BREAST ENHANCEMENT SURGERY  1990   BREAST SURGERY     CESAREAN SECTION  1982   CHOLECYSTECTOMY N/A 2017   EXTRACORPOREAL SHOCK WAVE LITHOTRIPSY Left 06/24/2021   Procedure: EXTRACORPOREAL SHOCK WAVE LITHOTRIPSY (ESWL);  Surgeon: Marcine Matar, MD;  Location: [facility];  Service: Urology;  Laterality: Left;   EYE SURGERY      Allergies:  Sulfa antibiotics   Social History: The patient  reports that she has quit smoking. She has never used smokeless tobacco. She reports that she does not drink alcohol and does not  use drugs.   Family History: The patient's family history is not on file.   ROS:  Please see the history of present illness. Otherwise, complete review of systems is positive for none  All other systems are reviewed and negative.   Physical Exam: VS:  There were no vitals taken for this visit., BMI There is no height or weight on file to calculate BMI.  Wt Readings from Last 3 Encounters:  02/09/22 204 lb 3.2 oz (92.6 kg)  12/14/21 201 lb 9.6 oz (91.4 kg)  11/29/21 204 lb (92.5 kg)    General: Patient appears comfortable at rest. HEENT: Conjunctiva and lids normal, oropharynx clear with moist mucosa. Neck: Supple, no elevated JVP or carotid bruits, no thyromegaly. Lungs: Clear to auscultation, nonlabored breathing at rest. Cardiac: Regular rate and rhythm, no S3 or significant systolic murmur, no pericardial rub. Abdomen: Soft, nontender, no hepatomegaly, bowel sounds present, no guarding or rebound. Extremities: No pitting edema, distal pulses 2+. Skin: Warm and dry. Musculoskeletal: No kyphosis. Neuropsychiatric: Alert and oriented x3, affect grossly appropriate.  Recent Labwork: No results found for requested labs within last 365 days.  No results found for: "CHOL", "TRIG", "HDL", "CHOLHDL", "VLDL", "LDLCALC", "LDLDIRECT"   Assessment and Plan:  Patient is a 68 year old F known to have insulin-dependent diabetes mellitus type 1 on insulin pump, hypothyroidism, HLD was referred to cardiology clinic for evaluation of DOE.  # DOE # Chest pain -Patient started to have DOE and chest  pains (sharp/stabbing and chest pressure substernally, at rest and exertion, multiple times in a week) x 5 to 6 months.  Cardiac risk factors are insulin-dependent diabetes mellitus type 1 on insulin pump. obtain 2D echocardiogram and CTA cardiac.  # HTN, controlled -Continue losartan 25 mg once daily, HTN per PCP.  # HLD -Continue rosuvastatin 10 mg nightly, HLD per PCP.  I have spent a total of  45 minutes with patient reviewing chart, EKGs, labs and examining patient as well as establishing an assessment and plan that was discussed with the patient.  > 50% of time was spent in direct patient care.    Medication Adjustments/Labs and Tests Ordered: Current medicines are reviewed at length with the patient today.  Concerns regarding medicines are outlined above.   Tests Ordered: Orders Placed This Encounter  Procedures   CT CORONARY MORPH W/CTA COR W/SCORE W/CA W/CM &/OR WO/CM   Basic metabolic panel   EKG 12-Lead   ECHOCARDIOGRAM COMPLETE     Medication Changes: Meds ordered this encounter  Medications   metoprolol tartrate (LOPRESSOR) 100 MG tablet    Sig: Take 1 tablet (100 mg total) by mouth once for 1 dose.    Dispense:  1 tablet    Refill:  3    03/27/2023- One dose only      Disposition:  Follow up  pending results  Signed Evelia Waskey Verne Spurr, MD, 03/27/2023 1:10 PM    [facility] at [facility] [address] [place], [place], [place] [address]

## 2023-05-08 ENCOUNTER — Telehealth (HOSPITAL_COMMUNITY): Payer: Self-pay | Admitting: *Deleted

## 2023-05-08 NOTE — Telephone Encounter (Signed)
Attempted to call patient regarding upcoming cardiac CT appointment. Left message on voicemail with name and callback number Hayley Sharpe RN Navigator Cardiac Imaging [facility] [phone] Office   

## 2023-05-09 ENCOUNTER — Ambulatory Visit (HOSPITAL_BASED_OUTPATIENT_CLINIC_OR_DEPARTMENT_OTHER)
Admission: RE | Admit: 2023-05-09 | Discharge: 2023-05-09 | Disposition: A | Discharge status: Home / Self Care | Payer: Medicare HMO | Source: Ambulatory Visit | Attending: Cardiology | Admitting: Cardiology

## 2023-05-09 ENCOUNTER — Ambulatory Visit (HOSPITAL_COMMUNITY)
Admission: RE | Admit: 2023-05-09 | Discharge: 2023-05-09 | Disposition: A | Discharge status: Home / Self Care | Payer: Medicare HMO | Source: Ambulatory Visit | Attending: Internal Medicine | Admitting: Internal Medicine

## 2023-05-09 ENCOUNTER — Ambulatory Visit (HOSPITAL_BASED_OUTPATIENT_CLINIC_OR_DEPARTMENT_OTHER)
Admission: RE | Admit: 2023-05-09 | Discharge: 2023-05-09 | Disposition: A | Discharge status: Home / Self Care | Payer: Medicare HMO | Source: Ambulatory Visit | Attending: Internal Medicine | Admitting: Internal Medicine

## 2023-05-09 ENCOUNTER — Other Ambulatory Visit: Payer: Self-pay | Admitting: Cardiology

## 2023-05-09 DIAGNOSIS — E119 Type 2 diabetes mellitus without complications: Secondary | ICD-10-CM | POA: Insufficient documentation

## 2023-05-09 DIAGNOSIS — R931 Abnormal findings on diagnostic imaging of heart and coronary circulation: Secondary | ICD-10-CM | POA: Diagnosis not present

## 2023-05-09 DIAGNOSIS — I080 Rheumatic disorders of both mitral and aortic valves: Secondary | ICD-10-CM | POA: Diagnosis not present

## 2023-05-09 DIAGNOSIS — I2510 Atherosclerotic heart disease of native coronary artery without angina pectoris: Secondary | ICD-10-CM | POA: Diagnosis not present

## 2023-05-09 DIAGNOSIS — I10 Essential (primary) hypertension: Secondary | ICD-10-CM | POA: Insufficient documentation

## 2023-05-09 DIAGNOSIS — R079 Chest pain, unspecified: Secondary | ICD-10-CM

## 2023-05-09 DIAGNOSIS — E785 Hyperlipidemia, unspecified: Secondary | ICD-10-CM | POA: Diagnosis not present

## 2023-05-09 DIAGNOSIS — R0600 Dyspnea, unspecified: Secondary | ICD-10-CM | POA: Diagnosis present

## 2023-05-09 LAB — ECHOCARDIOGRAM COMPLETE

## 2023-05-09 LAB — POCT I-STAT CREATININE: Creatinine, Ser: 0.7 mg/dL (ref 0.44–1.00)

## 2023-05-09 MED ORDER — NITROGLYCERIN 0.4 MG SL SUBL
SUBLINGUAL_TABLET | SUBLINGUAL | Status: AC
  Filled 2023-05-09: qty 2

## 2023-05-09 MED ORDER — NITROGLYCERIN 0.4 MG SL SUBL
0.8000 mg | SUBLINGUAL_TABLET | SUBLINGUAL | Status: DC | PRN
  Administered 2023-05-09: 0.8 mg via SUBLINGUAL

## 2023-05-09 MED ORDER — IOHEXOL 350 MG/ML SOLN
95.0000 mL | Freq: Once | INTRAVENOUS | Status: AC | PRN
  Administered 2023-05-09: 95 mL via INTRAVENOUS

## 2023-05-09 MED ORDER — PERFLUTREN LIPID MICROSPHERE
1.0000 mL | INTRAVENOUS | Status: DC | PRN
  Administered 2023-05-09: 4 mL via INTRAVENOUS

## 2023-05-10 LAB — ECHOCARDIOGRAM COMPLETE
Area-P 1/2: 3.56 cm2
S' Lateral: 2.7 cm

## 2023-05-11 ENCOUNTER — Telehealth: Payer: Self-pay | Admitting: Internal Medicine

## 2023-05-11 DIAGNOSIS — Z79899 Other long term (current) drug therapy: Secondary | ICD-10-CM

## 2023-05-11 DIAGNOSIS — R0609 Other forms of dyspnea: Secondary | ICD-10-CM

## 2023-05-11 MED ORDER — TORSEMIDE 20 MG PO TABS: 20.0000 mg | ORAL_TABLET | Freq: Every day | ORAL | 1 refills | Status: DC

## 2023-05-11 NOTE — Telephone Encounter (Signed)
Pt returning nurses call regarding results. Please advise 

## 2023-05-11 NOTE — Telephone Encounter (Signed)
Patient states that her PCP is requesting results from test taken yesterday 07/17 be sent to their office at:   Fax # [phone]

## 2023-05-11 NOTE — Telephone Encounter (Signed)
-----   Message from Vishnu P Mallipeddi sent at 05/10/2023  9:10 AM EDT ----- Normal pumping function of the heart, G2 DD (evidence of stiff heart) and no valvular heart disease.  Start p.o. torsemide 20 mg once daily and obtain BMP in 5 days.

## 2023-05-11 NOTE — Telephone Encounter (Signed)
-----   Message from Vishnu P Mallipeddi sent at 05/10/2023  8:45 AM EDT ----- Coronary calcium score is 2932 (more than 400 is considered to be significant). 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 Schedule follow-up visit in 2 weeks with MD/APP to schedule for LHC.

## 2023-05-11 NOTE — Telephone Encounter (Signed)
Echo and cardiac cta already faxed to PCP

## 2023-05-11 NOTE — Telephone Encounter (Signed)
Patient informed and verbalized understanding of plan. Lab order faxed to [facility] 

## 2023-05-12 ENCOUNTER — Telehealth: Payer: Self-pay | Admitting: Internal Medicine

## 2023-05-12 NOTE — Telephone Encounter (Signed)
Patient is asking that her results from her test be mailed out to her. Patient is unable to access mychart. Please advise

## 2023-05-12 NOTE — Telephone Encounter (Signed)
Patient walked into the office stating she wanted to speak with Dr. Jenene Slicker in regards to recent test results.  She stated that she spoke with (2) different people on 05-11-23 regarding her test results. She doesn't understand what was told to her. She had questions about why she was put on a fluid pill.  [phone] & [phone]

## 2023-05-15 NOTE — Telephone Encounter (Signed)
Message from Vishnu P Mallipeddi sent at 05/10/2023  9:10 AM EDT ----- Normal pumping function of the heart, G2 DD (evidence of stiff heart) and no valvular heart disease.  Start p.o. torsemide 20 mg once daily and obtain BMP in 5 days  Message from Vishnu P Mallipeddi sent at 05/10/2023  8:45 AM EDT ----- Coronary calcium score is 2932 (more than 400 is considered to be significant). 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 Schedule follow-up visit in 2 weeks with MD/APP to schedule for LHC.

## 2023-05-15 NOTE — Telephone Encounter (Signed)
Results reviewed with patient again in detail. Verbalized understanding.

## 2023-05-17 ENCOUNTER — Other Ambulatory Visit: Payer: Self-pay | Admitting: *Deleted

## 2023-05-17 ENCOUNTER — Encounter (HOSPITAL_COMMUNITY): Payer: Self-pay

## 2023-05-17 ENCOUNTER — Inpatient Hospital Stay: Admit: 2023-05-17 | Payer: Medicare HMO | Admitting: Cardiology

## 2023-05-17 DIAGNOSIS — R0609 Other forms of dyspnea: Secondary | ICD-10-CM

## 2023-05-17 DIAGNOSIS — Z79899 Other long term (current) drug therapy: Secondary | ICD-10-CM

## 2023-05-18 ENCOUNTER — Other Ambulatory Visit (HOSPITAL_COMMUNITY): Payer: Medicare HMO

## 2023-05-25 ENCOUNTER — Ambulatory Visit: Payer: Medicare HMO | Admitting: Internal Medicine

## 2023-07-03 ENCOUNTER — Ambulatory Visit: Payer: Medicare HMO | Admitting: Internal Medicine

## 2023-07-19 ENCOUNTER — Encounter: Payer: Self-pay | Admitting: "Endocrinology

## 2023-07-19 ENCOUNTER — Ambulatory Visit: Payer: Medicare HMO | Admitting: "Endocrinology

## 2023-07-19 VITALS — BP 116/68 | HR 76 | Ht 65.0 in | Wt 185.2 lb

## 2023-07-19 DIAGNOSIS — E782 Mixed hyperlipidemia: Secondary | ICD-10-CM

## 2023-07-19 DIAGNOSIS — E10319 Type 1 diabetes mellitus with unspecified diabetic retinopathy without macular edema: Secondary | ICD-10-CM | POA: Diagnosis not present

## 2023-07-19 DIAGNOSIS — E039 Hypothyroidism, unspecified: Secondary | ICD-10-CM

## 2023-07-19 DIAGNOSIS — Z794 Long term (current) use of insulin: Secondary | ICD-10-CM | POA: Diagnosis not present

## 2023-07-19 NOTE — Patient Instructions (Signed)

## 2023-07-19 NOTE — Progress Notes (Signed)
Endocrinology Consult Note       07/19/2023, 1:43 PM   Subjective:    Patient ID: Kristy King, female    DOB: [birth year].  Arma Schmucker is being seen in consultation for management of currently uncontrolled symptomatic diabetes requested by  Renaldo Harrison, DO.   Past Medical History:  Diagnosis Date   Complication of anesthesia    Diabetes mellitus, type II (HCC)    Dyspnea    Family history of adverse reaction to anesthesia    History of kidney stones    Hypertension    Hypothyroidism    PONV (postoperative nausea and vomiting)     Past Surgical History:  Procedure Laterality Date   APPENDECTOMY N/A 2015   BACK SURGERY     BREAST ENHANCEMENT SURGERY  1990   BREAST SURGERY     CESAREAN SECTION  1982   CHOLECYSTECTOMY N/A 2017   EXTRACORPOREAL SHOCK WAVE LITHOTRIPSY Left 06/24/2021   Procedure: EXTRACORPOREAL SHOCK WAVE LITHOTRIPSY (ESWL);  Surgeon: Marcine Matar, MD;  Location: [facility];  Service: Urology;  Laterality: Left;   EYE SURGERY      Social History   Socioeconomic History   Marital status: Unknown    Spouse name: Not on file   Number of children: Not on file   Years of education: Not on file   Highest education level: Not on file  Occupational History   Not on file  Tobacco Use   Smoking status: Former   Smokeless tobacco: Never  Substance and Sexual Activity   Alcohol use: No    Alcohol/week: 0.0 standard drinks of alcohol   Drug use: No   Sexual activity: Not on file  Other Topics Concern   Not on file  Social History Narrative   Not on file   Social Determinants of Health   Financial Resource Strain: Not on file  Food Insecurity: Low Risk  (06/27/2023)   Received from [facility]   Hunger Vital Sign    Worried About Running Out of Food in the Last Year: Never true    Ran Out of Food in the Last Year: Never true  Transportation Needs: No  Transportation Needs (06/27/2023)   Received from [facility]    In the past 12 months, has lack of reliable transportation kept you from medical appointments, meetings, work or from getting things needed for daily living? : No  Physical Activity: Not on file  Stress: Not on file  Social Connections: Not on file    Family History  Problem Relation Age of Onset   Valvular heart disease Mother    Cancer Sister    Heart attack Paternal Grandfather    Diabetes Neg Hx     Outpatient Encounter Medications as of 07/19/2023  Medication Sig   busPIRone (BUSPAR) 5 MG tablet Take 5 mg by mouth 3 (three) times daily as needed.   citalopram (CELEXA) 20 MG tablet Take 30 mg by mouth daily.   Continuous Blood Gluc Sensor (FREESTYLE LIBRE 2 SENSOR) MISC 1 Device by Does not apply route every 14 (fourteen) days.   HUMALOG 100 UNIT/ML  injection Inject 60 Units into the skin as directed. Per insulin pump   levothyroxine (SYNTHROID) 175 MCG tablet Take 175 mcg by mouth daily before breakfast.   losartan (COZAAR) 25 MG tablet Take 1 tablet (25 mg total) by mouth daily.   melatonin 1 MG TABS tablet Take 5 mg by mouth at bedtime.   metoprolol tartrate (LOPRESSOR) 100 MG tablet Take 1 tablet (100 mg total) by mouth once for 1 dose.   oxymetazoline (AFRIN) 0.05 % nasal spray Place 1 spray into both nostrils 2 (two) times daily as needed for congestion.   rosuvastatin (CRESTOR) 10 MG tablet Take 10 mg by mouth daily.   torsemide (DEMADEX) 20 MG tablet Take 1 tablet (20 mg total) by mouth daily.   No facility-administered encounter medications on file as of 07/19/2023.    ALLERGIES: Allergies  Allergen Reactions   Epinephrine     tachycardia   Sulfa Antibiotics     VACCINATION STATUS: Immunization History  Administered Date(s) Administered   Moderna Sars-Covid-2 Vaccination 12/29/2019, 01/26/2020, 08/19/2020, 03/09/2021    Diabetes She presents for her initial diabetic visit. She  has type 1 diabetes mellitus. Onset time: She was diagnosed at approximate age of 21 years.  This patient was seen in this clinic in 2017, failed to return for follow-up appointments. Hypoglycemia symptoms include headaches, hunger, sweats and tremors. Pertinent negatives for hypoglycemia include no confusion, pallor or seizures. Associated symptoms include fatigue, polydipsia, polyuria and visual change. Pertinent negatives for diabetes include no chest pain and no polyphagia. There are no hypoglycemic complications. Symptoms are worsening. Diabetic complications include heart disease and retinopathy. Risk factors for coronary artery disease include diabetes mellitus, dyslipidemia, obesity and tobacco exposure. Current diabetic treatment includes insulin pump. Her weight is increasing steadily. She is following a generally unhealthy diet. When asked about meal planning, she reported none. Her home blood glucose trend is fluctuating minimally. Her breakfast blood glucose range is generally 140-180 mg/dl. Her lunch blood glucose range is generally 140-180 mg/dl. Her dinner blood glucose range is generally 140-180 mg/dl. Her bedtime blood glucose range is generally 140-180 mg/dl. Her overall blood glucose range is 140-180 mg/dl. (She wears Medtronic MiniMed 630G insulin pump.  This patient was seen in 2017, failed to return for follow-up appointment.  She continues to have complicated basal flow settings including: 12 midnight 0.425, 2 AM 0.425, 4 AM 0.5, 6 AM 1.6, 9 AM 1.85, 3 PM 1.6, 6 PM 1.6, 8 PM 0.25 giving her 27.5 units of insulin as basal (62%) She boluses 1 unit for 10 g of carbs averaging 7.9 units/mL for [place] meal of [address]81 g daily (cans to teaching 38% of daily insulin). She did not bring her Josephine Igo, however her statistics from the pump shows wide variations in her blood glucose readings averaging 157 mg per DL 65.  She has 39% above range, 6% below range. Her major concern is hypoglycemia happening  in the second half of the day which makes her hungry and eat without her plan. Her most recent A1c was 7% on May 24, 2023.)  Hyperlipidemia This is a chronic problem. The current episode started more than 1 year ago. Exacerbating diseases include diabetes, hypothyroidism and obesity. Pertinent negatives include no chest pain, myalgias or shortness of breath. Current antihyperlipidemic treatment includes statins. Risk factors for coronary artery disease include diabetes mellitus, dyslipidemia, hypertension, a sedentary lifestyle, post-menopausal and obesity.  Hypertension This is a chronic problem. Associated symptoms include headaches and sweats. Pertinent negatives include no  chest pain, palpitations or shortness of breath. Risk factors for coronary artery disease include dyslipidemia, obesity, diabetes mellitus, post-menopausal state, sedentary lifestyle and smoking/tobacco exposure. Past treatments include angiotensin blockers. Hypertensive end-organ damage includes CAD/MI and retinopathy.     Review of Systems  Constitutional:  Positive for fatigue. Negative for chills, fever and unexpected weight change.  HENT:  Negative for trouble swallowing and voice change.   Eyes:  Negative for visual disturbance.  Respiratory:  Negative for cough, shortness of breath and wheezing.   Cardiovascular:  Negative for chest pain, palpitations and leg swelling.  Gastrointestinal:  Negative for diarrhea, nausea and vomiting.  Endocrine: Positive for polydipsia and polyuria. Negative for cold intolerance, heat intolerance and polyphagia.  Musculoskeletal:  Negative for arthralgias and myalgias.  Skin:  Negative for color change, pallor, rash and wound.  Neurological:  Positive for tremors and headaches. Negative for seizures.  Psychiatric/Behavioral:  Negative for confusion and suicidal ideas.     Objective:       07/19/2023    1:32 PM 05/09/2023    3:32 PM 05/09/2023    2:40 PM  Vitals with BMI   Height 5\' 5"     Weight 185 lbs 3 oz    BMI 30.82    Systolic  146 120  Diastolic  71 64  Pulse  58     Ht 5\' 5"  (1.651 m)   Wt 185 lb 3.2 oz (84 kg)   BMI 30.82 kg/m   Wt Readings from Last 3 Encounters:  07/19/23 185 lb 3.2 oz (84 kg)  03/27/23 195 lb 6.4 oz (88.6 kg)  02/09/22 204 lb 3.2 oz (92.6 kg)     Physical Exam Constitutional:      Appearance: She is well-developed.  HENT:     Head: Normocephalic and atraumatic.  Neck:     Thyroid: No thyromegaly.     Trachea: No tracheal deviation.  Cardiovascular:     Rate and Rhythm: Normal rate and regular rhythm.  Pulmonary:     Effort: Pulmonary effort is normal.     Breath sounds: Normal breath sounds.  Abdominal:     Tenderness: There is no abdominal tenderness. There is no guarding.  Musculoskeletal:        General: Normal range of motion.     Cervical back: Normal range of motion and neck supple.     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  Skin:    General: Skin is warm and dry.     Coloration: Skin is not pale.     Findings: No erythema or rash.  Neurological:     Mental Status: She is alert and oriented to person, place, and time.     Cranial Nerves: No cranial nerve deficit.     Coordination: Coordination normal.     Deep Tendon Reflexes: Reflexes are normal and symmetric.  Psychiatric:        Judgment: Judgment normal.     CMP ( most recent) CMP     Component Value Date/Time   CREATININE 0.70 05/09/2023 1502     Diabetic Labs (most recent): Lab Results  Component Value Date   HGBA1C 8.1 (A) 02/09/2022   HGBA1C 7.5 (A) 11/29/2021        1 mo ago   Sodium 136 - 145 mmol/L 131 Low   Potassium 3.5 - 5.1 mmol/L 3.8  Comment: NO VISIBLE HEMOLYSIS  Chloride 98 - 107 mmol/L 95 Low   CO2 21 - 31  mmol/L 31  Anion Gap 6 - 14 mmol/L 5 Low   Glucose, Random 70 - 99 mg/dL 213 High   Blood Urea Nitrogen (BUN) 7 - 25 mg/dL 12  Creatinine 0.86 - 5.78  mg/dL 4.69  eGFR >62 XB/MWU/1.32G4 84    Calcium 8.6 - 10.3 mg/dL 8.6    Latest Reference Range & Units 11/29/21 14:53 02/09/22 14:45  Hemoglobin A1C 4.0 - 5.6 % 7.5 ! 8.1 !      Assessment & Plan:   Type 1 diabetes  - Sifa Troeger has currently uncontrolled symptomatic type 1 DM since  67 years of age,  with most recent A1c of 7 %.  His average profile is at the expense of widely fluctuating glycemic profile including 39% hyperglycemia and 6% hypoglycemia. She wears Medtronic MiniMed 630G insulin pump.  This patient was seen in 2017, failed to return for follow-up appointment.  She continues to have complicated basal flow settings including: 12 midnight 0.425, 2 AM 0.425, 4 AM 0.5, 6 AM 1.6, 9 AM 1.85, 3 PM 1.6, 6 PM 1.6, 8 PM 0.25 giving her 27.5 units of insulin as basal (62%) She boluses 1 unit for 10 g of carbs averaging 7.9 units/mL for average meal of 01027 g daily (cans to teaching 38% of daily insulin). She did not bring her Josephine Igo, however her statistics from the pump shows wide variations in her blood glucose readings averaging 157 mg per DL 65.  She has 39% above range, 6% below range. Her major concern is hypoglycemia happening in the second half of the day which makes her hungry and eat without her plan. Her most recent A1c was 7% on May 24, 2023.  Recent labs reviewed. - I had a long discussion with her about the possible risk factors and  the pathology behind its diabetes and its complications. -her diabetes is complicated by coronary artery disease, retinopathy, comorbid hyperlipidemia/hypertension, hypothyroidism and she remains at a high risk for more acute and chronic complications which include CAD, CVA, CKD, retinopathy, and neuropathy. These are all discussed in detail with her.  - I discussed all available options of managing her diabetes including de-escalation of medications. I have counseled her on Food as Medicine by adopting a Whole Food , Plant Predominant  (  WFPP) nutrition as recommended by [employer] of Lifestyle Medicine. Patient is encouraged to switch to  unprocessed or minimally processed  complex starch, adequate protein intake (mainly plant source), minimal liquid fat, plenty of fruits, and vegetables. -  she is advised to stick to a routine mealtimes to eat 3 complete meals a day and snack only when necessary ( to snack only to correct hypoglycemia BG <70 day time or <100 at night).   - she acknowledges that there is a room for improvement in her food and drink choices. - Further Specific Suggestion is made for her to avoid simple carbohydrates  from her diet including Cakes, Sweet Desserts, Ice Cream, Soda (diet and regular), Sweet Tea, Candies, Chips, Cookies, Store Bought Juices, Alcohol ,  Artificial Sweeteners,  Coffee Creamer, and "Sugar-free" Products. This will help patient to have more stable blood glucose profile and potentially avoid unintended weight gain.   - she will be scheduled with Norm Salt, RDN, CDE for individualized diabetes education.  - I have approached her with the following individualized plan to manage  her diabetes and patient agrees:   -For the 4+ decades of type 1 diabetes, she reports only 1 episode of diabetes  ketoacidosis.  She wore insulin pump for more than 10 years and she would like to keep on pump therapy.    -Priority will be to avoid hypoglycemia and is worried patient first and later address significant hyperglycemic episodes.    First attempt will be to simplify her basal floor from 12 different flow rates to only 3.   -I discussed and worked with the patient to adjust her basal Humalog flow via her Medtronic MiniMed 630G insulin pump to 0.5 units/h 12:AM- 6 AM, lower to 0.8 from 6 AM - 6 PM, and adjust her basal flow to 0.5 from 6 PM - 12 00AM.  This will give her 15.4 units of insulin as a basal flow every 24 hours from her current dose of 27.5 units(62%).  Ideally 50% basal-50%  bolus.  She is allowed to continue her current bolus scheme of 1 unit for 10 g of carbs with only 3 meals daily- BKFAST 6-8 AM, Lunch 12-2 PM, and supper 5-7 PM.   She is advised to lower her carbs consumption to 40-60 per meal which would give her 4 - 6 units of prandial insulin as bolus.  -Significant lowering of her daytime basal insulin flow rate should eliminate her hypoglycemia which is making her hungry and eat more than she planned hence difficult time controlling her weight.  -Her insulin sensitivity factor is 50, target blood glucose is 100-120 across a day, insulin active time is 3 hours.  These parameters are kept the same without change.  She is encouraged to use her CGM-Libre device and bring it with her during her next visit in 4 weeks.  - she is encouraged to call clinic for blood glucose levels less than 70 or above 200 mg /dl. -She will be kept on exclusive insulin treatment via insulin pump for now.  - Specific targets for  A1c;  LDL, HDL,  and Triglycerides were discussed with the patient.  2) Blood Pressure /Hypertension:  her blood pressure is  controlled to target.   she is advised to continue her current medications including losartan 25 mg p.o. daily with breakfast . 3) Lipids/Hyperlipidemia:   She does not have recent lipid panel to review.  She is advised to continue rosuvastatin 10 mg p.o. nightly.  She will be considered for fasting lipid panel on subsequent visits.  4) hypothyroidism: The circumstance of her diagnosis are not available to review.  She does not have recent thyroid function test to review. She is advised to continue her levothyroxine at current dose of 175 mg p.o. daily before breakfast.   - We discussed about the correct intake of her thyroid hormone, on empty stomach at fasting, with water, separated by at least 30 minutes from breakfast and other medications,  and separated by more than 4 hours from calcium, iron, multivitamins, acid reflux  medications (PPIs). -Patient is made aware of the fact that thyroid hormone replacement is needed for life, dose to be adjusted by periodic monitoring of thyroid function tests.  5) obesity :  Body mass index is 30.82 kg/m.  -   clearly complicating her diabetes care.   she is  a candidate for weight loss. I discussed with her the fact that loss of 5 - 10% of her  current body weight will have the most impact on her diabetes management.  The above detailed  ACLM recommendations for nutrition, exercise, sleep, social life, avoidance of risky substances, the need for restorative sleep   information will also  detailed on discharge instructions.  6) Chronic Care/Health Maintenance:  -she  is on ACEI/ARB and Statin medications and  is encouraged to initiate and continue to follow up with Ophthalmology, Dentist,  Podiatrist at least yearly or according to recommendations, and advised to   stay away from smoking. I have recommended yearly flu vaccine and pneumonia vaccine at least every 5 years; moderate intensity exercise for up to 150 minutes weekly; and  sleep for 7- 9 hours a day.  - she is  advised to maintain close follow up with Renaldo Harrison, DO for primary care needs, as well as her other providers for optimal and coordinated care.   Thank you for involving me in the care of this pleasant patient.  I spent  65  minutes in the care of the patient today including review of labs from CMP, Lipids, Thyroid Function, Hematology (current and previous including abstractions from other facilities); face-to-face time discussing  her blood glucose readings/logs, discussing hypoglycemia and hyperglycemia episodes and symptoms, medications doses, her options of short and long term treatment based on the latest standards of care / guidelines;  discussion about incorporating lifestyle medicine;  and documenting the encounter. Risk reduction counseling performed per USPSTF guidelines to reduce  obesity and  cardiovascular risk factors.      Please refer to Patient Instructions for Blood Glucose Monitoring and Insulin/Medications Dosing Guide"  in media tab for additional information. Please  also refer to " Patient Self Inventory" in the Media  tab for reviewed elements of pertinent patient history.  Rivka Spring participated in the discussions, expressed understanding, and voiced agreement with the above plans.  All questions were answered to her satisfaction. she is encouraged to contact clinic should she have any questions or concerns prior to her return visit.   Follow up plan: 4 weeks with her meter-Libre  Marquis Lunch, MD [facility] [address] [place], [place] [address] Phone: [phone]  Fax: [phone]    07/19/2023, 1:43 PM  This note was partially dictated with voice recognition software. Similar sounding words can be transcribed inadequately or may not  be corrected upon review.

## 2023-07-25 ENCOUNTER — Encounter (HOSPITAL_COMMUNITY): Payer: Medicare HMO

## 2023-07-27 ENCOUNTER — Encounter (HOSPITAL_COMMUNITY): Payer: Medicare HMO

## 2023-07-31 ENCOUNTER — Encounter (HOSPITAL_COMMUNITY)
Admission: RE | Admit: 2023-07-31 | Discharge: 2023-07-31 | Disposition: A | Discharge status: Home / Self Care | Payer: Medicare HMO | Source: Ambulatory Visit | Attending: Thoracic Diseases | Admitting: Thoracic Diseases

## 2023-07-31 VITALS — Ht 64.5 in | Wt 185.5 lb

## 2023-07-31 DIAGNOSIS — Z951 Presence of aortocoronary bypass graft: Secondary | ICD-10-CM | POA: Insufficient documentation

## 2023-07-31 NOTE — Progress Notes (Signed)
Cardiac Individual Treatment Plan  Patient Details  Name: Kristy King MRN: [id number] Date of Birth: 1956-06-26 Referring Provider:   Flowsheet Row CARDIAC REHAB PHASE II ORIENTATION from 07/31/2023 in [facility] CARDIAC REHABILITATION  Referring Provider Delilah Shan       Initial Encounter Date:  Flowsheet Row CARDIAC REHAB PHASE II ORIENTATION from 07/31/2023 in [place] CARDIAC REHABILITATION  Date 07/31/23       Visit Diagnosis: S/P CABG x 3  Patient's Home Medications on Admission:  Current Outpatient Medications:    acetaminophen (TYLENOL) 500 MG tablet, Take 1,000 mg by mouth every 6 (six) hours as needed., Disp: , Rfl:    amiodarone (PACERONE) 200 MG tablet, Take 1 tablet by mouth daily., Disp: , Rfl:    aspirin EC 81 MG tablet, Take 1 tablet by mouth daily., Disp: , Rfl:    busPIRone (BUSPAR) 5 MG tablet, Take 5 mg by mouth 3 (three) times daily as needed., Disp: , Rfl:    citalopram (CELEXA) 20 MG tablet, Take 30 mg by mouth daily., Disp: , Rfl:    clopidogrel (PLAVIX) 75 MG tablet, Take 1 tablet by mouth daily., Disp: , Rfl:    Continuous Blood Gluc Sensor (FREESTYLE LIBRE 2 SENSOR) MISC, 1 Device by Does not apply route every 14 (fourteen) days., Disp: 6 each, Rfl: 3   ezetimibe (ZETIA) 10 MG tablet, Take 1 tablet by mouth at bedtime., Disp: , Rfl:    HUMALOG 100 UNIT/ML injection, Inject 60 Units into the skin as directed. Per insulin pump, Disp: , Rfl:    levothyroxine (SYNTHROID) 175 MCG tablet, Take 175 mcg by mouth daily before breakfast., Disp: , Rfl:    losartan (COZAAR) 25 MG tablet, Take 1 tablet (25 mg total) by mouth daily., Disp: 30 tablet, Rfl: 3   melatonin 1 MG TABS tablet, Take 5 mg by mouth at bedtime., Disp: , Rfl:    metoprolol tartrate (LOPRESSOR) 25 MG tablet, Take 0.5 tablets by mouth 2 (two) times daily., Disp: , Rfl:    oxymetazoline (AFRIN) 0.05 % nasal spray, Place 1 spray into both nostrils 2 (two) times daily as needed for congestion., Disp:  , Rfl:    polyethylene glycol (MIRALAX / GLYCOLAX) 17 g packet, Take 17 g by mouth daily., Disp: , Rfl:    rosuvastatin (CRESTOR) 10 MG tablet, Take 10 mg by mouth daily., Disp: , Rfl:    torsemide (DEMADEX) 20 MG tablet, Take 1 tablet (20 mg total) by mouth daily. (Patient not taking: Reported on 07/31/2023), Disp: 90 tablet, Rfl: 1  Past Medical History: Past Medical History:  Diagnosis Date   Complication of anesthesia    Diabetes mellitus, type II (HCC)    Dyspnea    Family history of adverse reaction to anesthesia    History of kidney stones    Hypertension    Hypothyroidism    PONV (postoperative nausea and vomiting)     Tobacco Use: Social History   Tobacco Use  Smoking Status Former  Smokeless Tobacco Never    Labs: Review Flowsheet       Latest Ref Rng & Units 11/29/2021 02/09/2022  Labs for ITP Cardiac and Pulmonary Rehab  Hemoglobin A1c 4.0 - 5.6 % 7.5  8.1     Details            Capillary Blood Glucose: Lab Results  Component Value Date   GLUCAP 182 (H) 06/24/2021   GLUCAP 247 (H) 06/24/2021     Exercise Target Goals: Exercise  Program Goal: Individual exercise prescription set using results from initial 6 min walk test and THRR while considering  patient's activity barriers and safety.   Exercise Prescription Goal: Starting with aerobic activity 30 plus minutes a day, 3 days per week for initial exercise prescription. Provide home exercise prescription and guidelines that participant acknowledges understanding prior to discharge.  Activity Barriers & Risk Stratification:  Activity Barriers & Cardiac Risk Stratification - 07/31/23 1441       Activity Barriers & Cardiac Risk Stratification   Activity Barriers Arthritis;Fibromyalgia;Back Problems;History of Falls;Balance Concerns;Incisional Pain;Shortness of Breath    Cardiac Risk Stratification Moderate             6 Minute Walk:  6 Minute Walk     Row Name 07/31/23 1601         6  Minute Walk   Phase Initial     Distance 1300 feet     Walk Time 6 minutes     # of Rest Breaks 0     MPH 2.46     METS 2.97     RPE 14     Perceived Dyspnea  1     VO2 Peak 10.38     Symptoms Yes (comment)     Comments leg fatigue/soreness in thighs 4/10, SOB     Resting HR 87 bpm     Resting BP 124/64     Resting Oxygen Saturation  99 %     Exercise Oxygen Saturation  during 6 min walk 97 %     Max Ex. HR 113 bpm     Max Ex. BP 128/74     2 Minute Post BP 124/62              Oxygen Initial Assessment:   Oxygen Re-Evaluation:   Oxygen Discharge (Final Oxygen Re-Evaluation):   Initial Exercise Prescription:  Initial Exercise Prescription - 07/31/23 1600       Date of Initial Exercise RX and Referring Provider   Date 07/31/23    Referring Provider Delilah Shan      Oxygen   Maintain Oxygen Saturation 88% or higher      Treadmill   MPH 2.4    Grade 0.5    Minutes 15    METs 3      NuStep   Level 3    SPM 80    Minutes 15    METs 3      Prescription Details   Frequency (times per week) 3    Duration Progress to 30 minutes of continuous aerobic without signs/symptoms of physical distress      Intensity   THRR 40-80% of Max Heartrate 114-141    Ratings of Perceived Exertion 11-13    Perceived Dyspnea 0-4      Progression   Progression Continue to progress workloads to maintain intensity without signs/symptoms of physical distress.      Resistance Training   Training Prescription Yes    Weight 3 lb    Reps 10-15             Perform Capillary Blood Glucose checks as needed.  Exercise Prescription Changes:   Exercise Prescription Changes     Row Name 07/31/23 1600             Response to Exercise   Blood Pressure (Admit) 124/64       Blood Pressure (Exercise) 128/74       Blood Pressure (Exit) 124/62  Heart Rate (Admit) 87 bpm       Heart Rate (Exercise) 113 bpm       Heart Rate (Exit) 89 bpm       Oxygen Saturation  (Admit) 99 %       Oxygen Saturation (Exercise) 97 %       Rating of Perceived Exertion (Exercise) 14       Perceived Dyspnea (Exercise) 1       Symptoms leg fatigue/soreness 4/10, SOB       Comments walk test results                Exercise Comments:   Exercise Goals and Review:   Exercise Goals     Row Name 07/31/23 1603             Exercise Goals   Increase Physical Activity Yes       Intervention Provide advice, education, support and counseling about physical activity/exercise needs.;Develop an individualized exercise prescription for aerobic and resistive training based on initial evaluation findings, risk stratification, comorbidities and participant's personal goals.       Expected Outcomes Short Term: Attend rehab on a regular basis to increase amount of physical activity.;Long Term: Add in home exercise to make exercise part of routine and to increase amount of physical activity.;Long Term: Exercising regularly at least 3-5 days a week.       Increase Strength and Stamina Yes       Intervention Provide advice, education, support and counseling about physical activity/exercise needs.;Develop an individualized exercise prescription for aerobic and resistive training based on initial evaluation findings, risk stratification, comorbidities and participant's personal goals.       Expected Outcomes Short Term: Increase workloads from initial exercise prescription for resistance, speed, and METs.;Short Term: Perform resistance training exercises routinely during rehab and add in resistance training at home;Long Term: Improve cardiorespiratory fitness, muscular endurance and strength as measured by increased METs and functional capacity ( )       Able to understand and use rate of perceived exertion (RPE) scale Yes       Intervention Provide education and explanation on how to use RPE scale       Expected Outcomes Short Term: Able to use RPE daily in rehab to express  subjective intensity level;Long Term:  Able to use RPE to guide intensity level when exercising independently       Able to understand and use Dyspnea scale Yes       Intervention Provide education and explanation on how to use Dyspnea scale       Expected Outcomes Short Term: Able to use Dyspnea scale daily in rehab to express subjective sense of shortness of breath during exertion;Long Term: Able to use Dyspnea scale to guide intensity level when exercising independently       Knowledge and understanding of Target Heart Rate Range (THRR) Yes       Intervention Provide education and explanation of THRR including how the numbers were predicted and where they are located for reference       Expected Outcomes Short Term: Able to state/look up THRR;Long Term: Able to use THRR to govern intensity when exercising independently;Short Term: Able to use daily as guideline for intensity in rehab       Able to check pulse independently Yes       Intervention Provide education and demonstration on how to check pulse in carotid and radial arteries.;Review the importance of being able to check your  own pulse for safety during independent exercise       Expected Outcomes Short Term: Able to explain why pulse checking is important during independent exercise;Long Term: Able to check pulse independently and accurately       Understanding of Exercise Prescription Yes       Intervention Provide education, explanation, and written materials on patient's individual exercise prescription       Expected Outcomes Short Term: Able to explain program exercise prescription;Long Term: Able to explain home exercise prescription to exercise independently                Exercise Goals Re-Evaluation :    Discharge Exercise Prescription (Final Exercise Prescription Changes):  Exercise Prescription Changes - 07/31/23 1600       Response to Exercise   Blood Pressure (Admit) 124/64    Blood Pressure (Exercise) 128/74     Blood Pressure (Exit) 124/62    Heart Rate (Admit) 87 bpm    Heart Rate (Exercise) 113 bpm    Heart Rate (Exit) 89 bpm    Oxygen Saturation (Admit) 99 %    Oxygen Saturation (Exercise) 97 %    Rating of Perceived Exertion (Exercise) 14    Perceived Dyspnea (Exercise) 1    Symptoms leg fatigue/soreness 4/10, SOB    Comments walk test results             Nutrition:  Target Goals: Understanding of nutrition guidelines, daily intake of sodium 1500mg , cholesterol 200mg , calories 30% from fat and 7% or less from saturated fats, daily to have 5 or more servings of fruits and vegetables.  Biometrics:  Pre Biometrics - 07/31/23 1604       Pre Biometrics   Height 5' 4.5" (1.638 m)    Weight 84.1 kg    Waist Circumference 32.5 inches    Hip Circumference 41 inches    Waist to Hip Ratio 0.79 %    BMI (Calculated) 31.36    Grip Strength 12.7 kg    Single Leg Stand 2.6 seconds              Nutrition Therapy Plan and Nutrition Goals:   Nutrition Assessments:  MEDIFICTS Score Key: >=70 Need to make dietary changes  40-70 Heart Healthy Diet <= 40 Therapeutic Level Cholesterol Diet  Flowsheet Row CARDIAC REHAB PHASE II ORIENTATION from 07/31/2023 in [facility] CARDIAC REHABILITATION  Picture Your Plate Total Score on Admission 54      Picture Your Plate Scores: <04 Unhealthy dietary pattern with much room for improvement. 41-50 Dietary pattern unlikely to meet recommendations for good health and room for improvement. 51-60 More healthful dietary pattern, with some room for improvement.  >60 Healthy dietary pattern, although there may be some specific behaviors that could be improved.    Nutrition Goals Re-Evaluation:   Nutrition Goals Discharge (Final Nutrition Goals Re-Evaluation):   Psychosocial: Target Goals: Acknowledge presence or absence of significant depression and/or stress, maximize coping skills, provide positive support system. Participant is able to  verbalize types and ability to use techniques and skills needed for reducing stress and depression.  Initial Review & Psychosocial Screening:  Initial Psych Review & Screening - 07/31/23 1540       Initial Review   Current issues with History of Depression;Current Psychotropic Meds      Family Dynamics   Good Support System? Yes      Barriers   Psychosocial barriers to participate in program Psychosocial barriers identified (see note)  Screening Interventions   Interventions Encouraged to exercise;To provide support and resources with identified psychosocial needs;Provide feedback about the scores to participant    Expected Outcomes Short Term goal: Utilizing psychosocial counselor, staff and physician to assist with identification of specific Stressors or current issues interfering with healing process. Setting desired goal for each stressor or current issue identified.;Long Term Goal: Stressors or current issues are controlled or eliminated.;Short Term goal: Identification and review with participant of any Quality of Life or Depression concerns found by scoring the questionnaire.;Long Term goal: The participant improves quality of Life and PHQ9 Scores as seen by post scores and/or verbalization of changes             Quality of Life Scores:  Quality of Life - 07/31/23 1604       Quality of Life   Select Quality of Life      Quality of Life Scores   Health/Function Pre 15.25 %    Socioeconomic Pre 27 %    Psych/Spiritual Pre 22.8 %    Family Pre 24 %    GLOBAL Pre 19.5 %            Scores of 19 and below usually indicate a poorer quality of life in these areas.  A difference of  2-3 points is a clinically meaningful difference.  A difference of 2-3 points in the total score of the Quality of Life Index has been associated with significant improvement in overall quality of life, self-image, physical symptoms, and general health in studies assessing change in quality  of life.  PHQ-9: Review Flowsheet       07/31/2023 12/09/2015  Depression screen PHQ 2/9  Decreased Interest 1 0  Down, Depressed, Hopeless 0 0  PHQ - 2 Score 1 0  Altered sleeping 0 -  Tired, decreased energy 1 -  Change in appetite 1 -  Feeling bad or failure about yourself  0 -  Trouble concentrating 0 -  Moving slowly or fidgety/restless 0 -  Suicidal thoughts 0 -  PHQ-9 Score 3 -  Difficult doing work/chores Not difficult at all -    Details           Interpretation of Total Score  Total Score Depression Severity:  1-4 = Minimal depression, 5-9 = Mild depression, 10-14 = Moderate depression, 15-19 = Moderately severe depression, 20-27 = Severe depression   Psychosocial Evaluation and Intervention:  Psychosocial Evaluation - 07/31/23 1541       Psychosocial Evaluation & Interventions   Interventions Stress management education;Relaxation education;Encouraged to exercise with the program and follow exercise prescription    Comments Patient was referred to CR with CABGx3. She had her surgery in July but did some home PT before starting CR. Her initial PHQ-9 score was 3 due to fatigue and over eating. She says she has a long history of depression and anxiety and is currently taking Citalopram and Buspar. She says her depression and anxiety has greatly improved since her surgery. She feels this is due to her having time to reflect on her life and get closer to [place]. She says she does not have any major stressors in her life. 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 She lives with her husband of 30 years. She says he is very supportative of her and she also has 2 friends that support her. She does have a son that lives in [place] but she talks to him often. Her main goals  for the program are to get back to normal; improve her energy and stamina, and get motivated to do things.    Expected Outcomes Short Term: start the program and attend consistently. Long  Term: Meet her personal goals.    Continue Psychosocial Services  Follow up required by staff             Psychosocial Re-Evaluation:   Psychosocial Discharge (Final Psychosocial Re-Evaluation):   Vocational Rehabilitation: Provide vocational rehab assistance to qualifying candidates.   Vocational Rehab Evaluation & Intervention:  Vocational Rehab - 07/31/23 1540       Initial Vocational Rehab Evaluation & Intervention   Assessment shows need for Vocational Rehabilitation No      Vocational Rehab Re-Evaulation   Comments Patient is retired.             Education: Education Goals: Education classes will be provided on a weekly basis, covering required topics. Participant will state understanding/return demonstration of topics presented.  Learning Barriers/Preferences:  Learning Barriers/Preferences - 07/31/23 1540       Learning Barriers/Preferences   Learning Barriers None    Learning Preferences Audio;Skilled Demonstration             Education Topics: Hypertension, Hypertension Reduction -Define heart disease and high blood pressure. Discus how high blood pressure affects the body and ways to reduce high blood pressure.   Exercise and Your Heart -Discuss why it is important to exercise, the FITT principles of exercise, normal and abnormal responses to exercise, and how to exercise safely.   Angina -Discuss definition of angina, causes of angina, treatment of angina, and how to decrease risk of having angina.   Cardiac Medications -Review what the following cardiac medications are used for, how they affect the body, and side effects that may occur when taking the medications.  Medications include Aspirin, Beta blockers, calcium channel blockers, ACE Inhibitors, angiotensin receptor blockers, diuretics, digoxin, and antihyperlipidemics.   Congestive Heart Failure -Discuss the definition of CHF, how to live with CHF, the signs and symptoms of CHF,  and how keep track of weight and sodium intake.   Heart Disease and Intimacy -Discus the effect sexual activity has on the heart, how changes occur during intimacy as we age, and safety during sexual activity.   Smoking Cessation / COPD -Discuss different methods to quit smoking, the health benefits of quitting smoking, and the definition of COPD.   Nutrition I: Fats -Discuss the types of cholesterol, what cholesterol does to the heart, and how cholesterol levels can be controlled.   Nutrition II: Labels -Discuss the different components of food labels and how to read food label   Heart Parts/Heart Disease and PAD -Discuss the anatomy of the heart, the pathway of blood circulation through the heart, and these are affected by heart disease.   Stress I: Signs and Symptoms -Discuss the causes of stress, how stress may lead to anxiety and depression, and ways to limit stress.   Stress II: Relaxation -Discuss different types of relaxation techniques to limit stress.   Warning Signs of Stroke / TIA -Discuss definition of a stroke, what the signs and symptoms are of a stroke, and how to identify when someone is having stroke.   Knowledge Questionnaire Score:   Core Components/Risk Factors/Patient Goals at Admission:  Personal Goals and Risk Factors at Admission - 07/31/23 1539       Core Components/Risk Factors/Patient Goals on Admission    Weight Management Obesity    Improve shortness of  breath with ADL's Yes    Intervention Provide education, individualized exercise plan and daily activity instruction to help decrease symptoms of SOB with activities of daily living.    Expected Outcomes Short Term: Improve cardiorespiratory fitness to achieve a reduction of symptoms when performing ADLs;Long Term: Be able to perform more ADLs without symptoms or delay the onset of symptoms    Diabetes Yes    Intervention Provide education about signs/symptoms and action to take for  hypo/hyperglycemia.;Provide education about proper nutrition, including hydration, and aerobic/resistive exercise prescription along with prescribed medications to achieve blood glucose in normal ranges: Fasting glucose 65-99 mg/dL    Expected Outcomes Short Term: Participant verbalizes understanding of the signs/symptoms and immediate care of hyper/hypoglycemia, proper foot care and importance of medication, aerobic/resistive exercise and nutrition plan for blood glucose control.;Long Term: Attainment of HbA1C < 7%.    Hypertension Yes    Intervention Provide education on lifestyle modifcations including regular physical activity/exercise, weight management, moderate sodium restriction and increased consumption of fresh fruit, vegetables, and low fat dairy, alcohol moderation, and smoking cessation.;Monitor prescription use compliance.    Expected Outcomes Short Term: Continued assessment and intervention until BP is < 140/2mm HG in hypertensive participants. < 130/47mm HG in hypertensive participants with diabetes, heart failure or chronic kidney disease.;Long Term: Maintenance of blood pressure at goal levels.    Lipids Yes    Intervention Provide education and support for participant on nutrition & aerobic/resistive exercise along with prescribed medications to achieve LDL 70mg , HDL >40mg .    Expected Outcomes Short Term: Participant states understanding of desired cholesterol values and is compliant with medications prescribed. Participant is following exercise prescription and nutrition guidelines.;Long Term: Cholesterol controlled with medications as prescribed, with individualized exercise RX and with personalized nutrition plan. Value goals: LDL < 70mg , HDL > 40 mg.             Core Components/Risk Factors/Patient Goals Review:    Core Components/Risk Factors/Patient Goals at Discharge (Final Review):    ITP Comments:   Comments: Patient arrived for 1st visit/orientation/education  at 1400. Patient was referred to CR by Dr. Delilah Shan due CABGx3. During orientation advised patient on arrival and appointment times what to wear, what to do before, during and after exercise. Reviewed attendance and class policy.  Pt is scheduled to return Cardiac Rehab on 08/04/23 at 1430. Pt was advised to come to class 15 minutes before class starts.  Discussed RPE/Dpysnea scales. Patient participated in warm up stretches. Patient was able to complete 6 minute walk test.  Telemetry:NSR. Patient was measured for the equipment. Discussed equipment safety with patient. Took patient pre-anthropometric measurements. Patient finished visit at 1530.

## 2023-08-02 ENCOUNTER — Encounter (HOSPITAL_COMMUNITY): Payer: Self-pay | Admitting: *Deleted

## 2023-08-02 DIAGNOSIS — Z951 Presence of aortocoronary bypass graft: Secondary | ICD-10-CM

## 2023-08-02 NOTE — Patient Instructions (Signed)
Patient Instructions  Patient Details  Name: Kristy King MRN: [id number] Date of Birth: 1955-12-17 Referring Provider:  Delilah Shan, MD  Below are your personal goals for exercise, nutrition, and risk factors. Our goal is to help you stay on track towards obtaining and maintaining these goals. We will be discussing your progress on these goals with you throughout the program.  Initial Exercise Prescription:  Initial Exercise Prescription - 07/31/23 1600       Date of Initial Exercise RX and Referring Provider   Date 07/31/23    Referring Provider Delilah Shan      Oxygen   Maintain Oxygen Saturation 88% or higher      Treadmill   MPH 2.4    Grade 0.5    Minutes 15    METs 3      NuStep   Level 3    SPM 80    Minutes 15    METs 3      Prescription Details   Frequency (times per week) 3    Duration Progress to 30 minutes of continuous aerobic without signs/symptoms of physical distress      Intensity   THRR 40-80% of Max Heartrate 114-141    Ratings of Perceived Exertion 11-13    Perceived Dyspnea 0-4      Progression   Progression Continue to progress workloads to maintain intensity without signs/symptoms of physical distress.      Resistance Training   Training Prescription Yes    Weight 3 lb    Reps 10-15             Exercise Goals: Frequency: Be able to perform aerobic exercise two to three times per week in program working toward 2-5 days per week of home exercise.  Intensity: Work with a perceived exertion of 11 (fairly light) - 15 (hard) while following your exercise prescription.  We will make changes to your prescription with you as you progress through the program.   Duration: Be able to do 30 to 45 minutes of continuous aerobic exercise in addition to a 5 minute warm-up and a 5 minute cool-down routine.   Nutrition Goals: Your personal nutrition goals will be established when you do your nutrition analysis with the dietician.  The following are  general nutrition guidelines to follow: Cholesterol < 200mg /day Sodium < 1500mg /day Fiber: Women over 50 yrs - 21 grams per day  Personal Goals:  Personal Goals and Risk Factors at Admission - 07/31/23 1539       Core Components/Risk Factors/Patient Goals on Admission    Weight Management Obesity    Improve shortness of breath with ADL's Yes    Intervention Provide education, individualized exercise plan and daily activity instruction to help decrease symptoms of SOB with activities of daily living.    Expected Outcomes Short Term: Improve cardiorespiratory fitness to achieve a reduction of symptoms when performing ADLs;Long Term: Be able to perform more ADLs without symptoms or delay the onset of symptoms    Diabetes Yes    Intervention Provide education about signs/symptoms and action to take for hypo/hyperglycemia.;Provide education about proper nutrition, including hydration, and aerobic/resistive exercise prescription along with prescribed medications to achieve blood glucose in normal ranges: Fasting glucose 65-99 mg/dL    Expected Outcomes Short Term: Participant verbalizes understanding of the signs/symptoms and immediate care of hyper/hypoglycemia, proper foot care and importance of medication, aerobic/resistive exercise and nutrition plan for blood glucose control.;Long Term: Attainment of HbA1C < 7%.    Hypertension Yes  Intervention Provide education on lifestyle modifcations including regular physical activity/exercise, weight management, moderate sodium restriction and increased consumption of fresh fruit, vegetables, and low fat dairy, alcohol moderation, and smoking cessation.;Monitor prescription use compliance.    Expected Outcomes Short Term: Continued assessment and intervention until BP is < 140/55mm HG in hypertensive participants. < 130/31mm HG in hypertensive participants with diabetes, heart failure or chronic kidney disease.;Long Term: Maintenance of blood pressure at  goal levels.    Lipids Yes    Intervention Provide education and support for participant on nutrition & aerobic/resistive exercise along with prescribed medications to achieve LDL 70mg , HDL >40mg .    Expected Outcomes Short Term: Participant states understanding of desired cholesterol values and is compliant with medications prescribed. Participant is following exercise prescription and nutrition guidelines.;Long Term: Cholesterol controlled with medications as prescribed, with individualized exercise RX and with personalized nutrition plan. Value goals: LDL < 70mg , HDL > 40 mg.             Tobacco Use Initial Evaluation: Social History   Tobacco Use  Smoking Status Former  Smokeless Tobacco Never    Exercise Goals and Review:  Exercise Goals     Row Name 07/31/23 1603             Exercise Goals   Increase Physical Activity Yes       Intervention Provide advice, education, support and counseling about physical activity/exercise needs.;Develop an individualized exercise prescription for aerobic and resistive training based on initial evaluation findings, risk stratification, comorbidities and participant's personal goals.       Expected Outcomes Short Term: Attend rehab on a regular basis to increase amount of physical activity.;Long Term: Add in home exercise to make exercise part of routine and to increase amount of physical activity.;Long Term: Exercising regularly at least 3-5 days a week.       Increase Strength and Stamina Yes       Intervention Provide advice, education, support and counseling about physical activity/exercise needs.;Develop an individualized exercise prescription for aerobic and resistive training based on initial evaluation findings, risk stratification, comorbidities and participant's personal goals.       Expected Outcomes Short Term: Increase workloads from initial exercise prescription for resistance, speed, and METs.;Short Term: Perform resistance  training exercises routinely during rehab and add in resistance training at home;Long Term: Improve cardiorespiratory fitness, muscular endurance and strength as measured by increased METs and functional capacity ( )       Able to understand and use rate of perceived exertion (RPE) scale Yes       Intervention Provide education and explanation on how to use RPE scale       Expected Outcomes Short Term: Able to use RPE daily in rehab to express subjective intensity level;Long Term:  Able to use RPE to guide intensity level when exercising independently       Able to understand and use Dyspnea scale Yes       Intervention Provide education and explanation on how to use Dyspnea scale       Expected Outcomes Short Term: Able to use Dyspnea scale daily in rehab to express subjective sense of shortness of breath during exertion;Long Term: Able to use Dyspnea scale to guide intensity level when exercising independently       Knowledge and understanding of Target Heart Rate Range (THRR) Yes       Intervention Provide education and explanation of THRR including how the numbers were predicted and where they are located  for reference       Expected Outcomes Short Term: Able to state/look up THRR;Long Term: Able to use THRR to govern intensity when exercising independently;Short Term: Able to use daily as guideline for intensity in rehab       Able to check pulse independently Yes       Intervention Provide education and demonstration on how to check pulse in carotid and radial arteries.;Review the importance of being able to check your own pulse for safety during independent exercise       Expected Outcomes Short Term: Able to explain why pulse checking is important during independent exercise;Long Term: Able to check pulse independently and accurately       Understanding of Exercise Prescription Yes       Intervention Provide education, explanation, and written materials on patient's individual exercise  prescription       Expected Outcomes Short Term: Able to explain program exercise prescription;Long Term: Able to explain home exercise prescription to exercise independently                Copy of goals given to participant.

## 2023-08-02 NOTE — Progress Notes (Signed)
Cardiac Individual Treatment Plan  Patient Details  Name: Kristy King MRN: [id number] Date of Birth: 1956-02-08 Referring Provider:   Flowsheet Row CARDIAC REHAB PHASE II ORIENTATION from 07/31/2023 in [facility] CARDIAC REHABILITATION  Referring Provider Delilah Shan       Initial Encounter Date:  Flowsheet Row CARDIAC REHAB PHASE II ORIENTATION from 07/31/2023 in [facility] [place] CARDIAC REHABILITATION  Date 07/31/23       Visit Diagnosis: S/P CABG x 3  Patient's Home Medications on Admission:  Current Outpatient Medications:    acetaminophen (TYLENOL) 500 MG tablet, Take 1,000 mg by mouth every 6 (six) hours as needed., Disp: , Rfl:    amiodarone (PACERONE) 200 MG tablet, Take 1 tablet by mouth daily., Disp: , Rfl:    aspirin EC 81 MG tablet, Take 1 tablet by mouth daily., Disp: , Rfl:    busPIRone (BUSPAR) 5 MG tablet, Take 5 mg by mouth 3 (three) times daily as needed., Disp: , Rfl:    citalopram (CELEXA) 20 MG tablet, Take 30 mg by mouth daily., Disp: , Rfl:    clopidogrel (PLAVIX) 75 MG tablet, Take 1 tablet by mouth daily., Disp: , Rfl:    Continuous Blood Gluc Sensor (FREESTYLE LIBRE 2 SENSOR) MISC, 1 Device by Does not apply route every 14 (fourteen) days., Disp: 6 each, Rfl: 3   ezetimibe (ZETIA) 10 MG tablet, Take 1 tablet by mouth at bedtime., Disp: , Rfl:    HUMALOG 100 UNIT/ML injection, Inject 60 Units into the skin as directed. Per insulin pump, Disp: , Rfl:    levothyroxine (SYNTHROID) 175 MCG tablet, Take 175 mcg by mouth daily before breakfast., Disp: , Rfl:    losartan (COZAAR) 25 MG tablet, Take 1 tablet (25 mg total) by mouth daily., Disp: 30 tablet, Rfl: 3   melatonin 1 MG TABS tablet, Take 5 mg by mouth at bedtime., Disp: , Rfl:    metoprolol tartrate (LOPRESSOR) 25 MG tablet, Take 0.5 tablets by mouth 2 (two) times daily., Disp: , Rfl:    oxymetazoline (AFRIN) 0.05 % nasal spray, Place 1 spray into both nostrils 2 (two) times daily as needed for congestion., Disp:  , Rfl:    polyethylene glycol (MIRALAX / GLYCOLAX) 17 g packet, Take 17 g by mouth daily., Disp: , Rfl:    rosuvastatin (CRESTOR) 10 MG tablet, Take 10 mg by mouth daily., Disp: , Rfl:    torsemide (DEMADEX) 20 MG tablet, Take 1 tablet (20 mg total) by mouth daily. (Patient not taking: Reported on 07/31/2023), Disp: 90 tablet, Rfl: 1  Past Medical History: Past Medical History:  Diagnosis Date   Complication of anesthesia    Diabetes mellitus, type II (HCC)    Dyspnea    Family history of adverse reaction to anesthesia    History of kidney stones    Hypertension    Hypothyroidism    PONV (postoperative nausea and vomiting)     Tobacco Use: Social History   Tobacco Use  Smoking Status Former  Smokeless Tobacco Never    Labs: Review Flowsheet       Latest Ref Rng & Units 11/29/2021 02/09/2022  Labs for ITP Cardiac and Pulmonary Rehab  Hemoglobin A1c 4.0 - 5.6 % 7.5  8.1     Details            Capillary Blood Glucose: Lab Results  Component Value Date   GLUCAP 182 (H) 06/24/2021   GLUCAP 247 (H) 06/24/2021     Exercise Target Goals: Exercise  Program Goal: Individual exercise prescription set using results from initial 6 min walk test and THRR while considering  patient's activity barriers and safety.   Exercise Prescription Goal: Starting with aerobic activity 30 plus minutes a day, 3 days per week for initial exercise prescription. Provide home exercise prescription and guidelines that participant acknowledges understanding prior to discharge.  Activity Barriers & Risk Stratification:  Activity Barriers & Cardiac Risk Stratification - 07/31/23 1441       Activity Barriers & Cardiac Risk Stratification   Activity Barriers Arthritis;Fibromyalgia;Back Problems;History of Falls;Balance Concerns;Incisional Pain;Shortness of Breath    Cardiac Risk Stratification Moderate             6 Minute Walk:  6 Minute Walk     Row Name 07/31/23 1601         6  Minute Walk   Phase Initial     Distance 1300 feet     Walk Time 6 minutes     # of Rest Breaks 0     MPH 2.46     METS 2.97     RPE 14     Perceived Dyspnea  1     VO2 Peak 10.38     Symptoms Yes (comment)     Comments leg fatigue/soreness in thighs 4/10, SOB     Resting HR 87 bpm     Resting BP 124/64     Resting Oxygen Saturation  99 %     Exercise Oxygen Saturation  during 6 min walk 97 %     Max Ex. HR 113 bpm     Max Ex. BP 128/74     2 Minute Post BP 124/62              Oxygen Initial Assessment:   Oxygen Re-Evaluation:   Oxygen Discharge (Final Oxygen Re-Evaluation):   Initial Exercise Prescription:  Initial Exercise Prescription - 07/31/23 1600       Date of Initial Exercise RX and Referring Provider   Date 07/31/23    Referring Provider Delilah Shan      Oxygen   Maintain Oxygen Saturation 88% or higher      Treadmill   MPH 2.4    Grade 0.5    Minutes 15    METs 3      NuStep   Level 3    SPM 80    Minutes 15    METs 3      Prescription Details   Frequency (times per week) 3    Duration Progress to 30 minutes of continuous aerobic without signs/symptoms of physical distress      Intensity   THRR 40-80% of Max Heartrate 114-141    Ratings of Perceived Exertion 11-13    Perceived Dyspnea 0-4      Progression   Progression Continue to progress workloads to maintain intensity without signs/symptoms of physical distress.      Resistance Training   Training Prescription Yes    Weight 3 lb    Reps 10-15             Perform Capillary Blood Glucose checks as needed.  Exercise Prescription Changes:   Exercise Prescription Changes     Row Name 07/31/23 1600             Response to Exercise   Blood Pressure (Admit) 124/64       Blood Pressure (Exercise) 128/74       Blood Pressure (Exit) 124/62  Heart Rate (Admit) 87 bpm       Heart Rate (Exercise) 113 bpm       Heart Rate (Exit) 89 bpm       Oxygen Saturation  (Admit) 99 %       Oxygen Saturation (Exercise) 97 %       Rating of Perceived Exertion (Exercise) 14       Perceived Dyspnea (Exercise) 1       Symptoms leg fatigue/soreness 4/10, SOB       Comments walk test results                Exercise Comments:   Exercise Goals and Review:   Exercise Goals     Row Name 07/31/23 1603             Exercise Goals   Increase Physical Activity Yes       Intervention Provide advice, education, support and counseling about physical activity/exercise needs.;Develop an individualized exercise prescription for aerobic and resistive training based on initial evaluation findings, risk stratification, comorbidities and participant's personal goals.       Expected Outcomes Short Term: Attend rehab on a regular basis to increase amount of physical activity.;Long Term: Add in home exercise to make exercise part of routine and to increase amount of physical activity.;Long Term: Exercising regularly at least 3-5 days a week.       Increase Strength and Stamina Yes       Intervention Provide advice, education, support and counseling about physical activity/exercise needs.;Develop an individualized exercise prescription for aerobic and resistive training based on initial evaluation findings, risk stratification, comorbidities and participant's personal goals.       Expected Outcomes Short Term: Increase workloads from initial exercise prescription for resistance, speed, and METs.;Short Term: Perform resistance training exercises routinely during rehab and add in resistance training at home;Long Term: Improve cardiorespiratory fitness, muscular endurance and strength as measured by increased METs and functional capacity ( )       Able to understand and use rate of perceived exertion (RPE) scale Yes       Intervention Provide education and explanation on how to use RPE scale       Expected Outcomes Short Term: Able to use RPE daily in rehab to express  subjective intensity level;Long Term:  Able to use RPE to guide intensity level when exercising independently       Able to understand and use Dyspnea scale Yes       Intervention Provide education and explanation on how to use Dyspnea scale       Expected Outcomes Short Term: Able to use Dyspnea scale daily in rehab to express subjective sense of shortness of breath during exertion;Long Term: Able to use Dyspnea scale to guide intensity level when exercising independently       Knowledge and understanding of Target Heart Rate Range (THRR) Yes       Intervention Provide education and explanation of THRR including how the numbers were predicted and where they are located for reference       Expected Outcomes Short Term: Able to state/look up THRR;Long Term: Able to use THRR to govern intensity when exercising independently;Short Term: Able to use daily as guideline for intensity in rehab       Able to check pulse independently Yes       Intervention Provide education and demonstration on how to check pulse in carotid and radial arteries.;Review the importance of being able to check your  own pulse for safety during independent exercise       Expected Outcomes Short Term: Able to explain why pulse checking is important during independent exercise;Long Term: Able to check pulse independently and accurately       Understanding of Exercise Prescription Yes       Intervention Provide education, explanation, and written materials on patient's individual exercise prescription       Expected Outcomes Short Term: Able to explain program exercise prescription;Long Term: Able to explain home exercise prescription to exercise independently                Exercise Goals Re-Evaluation :    Discharge Exercise Prescription (Final Exercise Prescription Changes):  Exercise Prescription Changes - 07/31/23 1600       Response to Exercise   Blood Pressure (Admit) 124/64    Blood Pressure (Exercise) 128/74     Blood Pressure (Exit) 124/62    Heart Rate (Admit) 87 bpm    Heart Rate (Exercise) 113 bpm    Heart Rate (Exit) 89 bpm    Oxygen Saturation (Admit) 99 %    Oxygen Saturation (Exercise) 97 %    Rating of Perceived Exertion (Exercise) 14    Perceived Dyspnea (Exercise) 1    Symptoms leg fatigue/soreness 4/10, SOB    Comments walk test results             Nutrition:  Target Goals: Understanding of nutrition guidelines, daily intake of sodium 1500mg , cholesterol 200mg , calories 30% from fat and 7% or less from saturated fats, daily to have 5 or more servings of fruits and vegetables.  Biometrics:  Pre Biometrics - 07/31/23 1604       Pre Biometrics   Height 5' 4.5" (1.638 m)    Weight 185 lb 8 oz (84.1 kg)    Waist Circumference 32.5 inches    Hip Circumference 41 inches    Waist to Hip Ratio 0.79 %    BMI (Calculated) 31.36    Grip Strength 12.7 kg    Single Leg Stand 2.6 seconds              Nutrition Therapy Plan and Nutrition Goals:   Nutrition Assessments:  MEDIFICTS Score Key: >=70 Need to make dietary changes  40-70 Heart Healthy Diet <= 40 Therapeutic Level Cholesterol Diet  Flowsheet Row CARDIAC REHAB PHASE II ORIENTATION from 07/31/2023 in [facility] CARDIAC REHABILITATION  Picture Your Plate Total Score on Admission 54      Picture Your Plate Scores: <16 Unhealthy dietary pattern with much room for improvement. 41-50 Dietary pattern unlikely to meet recommendations for good health and room for improvement. 51-60 More healthful dietary pattern, with some room for improvement.  >60 Healthy dietary pattern, although there may be some specific behaviors that could be improved.    Nutrition Goals Re-Evaluation:   Nutrition Goals Discharge (Final Nutrition Goals Re-Evaluation):   Psychosocial: Target Goals: Acknowledge presence or absence of significant depression and/or stress, maximize coping skills, provide positive support system.  Participant is able to verbalize types and ability to use techniques and skills needed for reducing stress and depression.  Initial Review & Psychosocial Screening:  Initial Psych Review & Screening - 07/31/23 1540       Initial Review   Current issues with History of Depression;Current Psychotropic Meds      Family Dynamics   Good Support System? Yes      Barriers   Psychosocial barriers to participate in program Psychosocial barriers identified (see  note)      Screening Interventions   Interventions Encouraged to exercise;To provide support and resources with identified psychosocial needs;Provide feedback about the scores to participant    Expected Outcomes Short Term goal: Utilizing psychosocial counselor, staff and physician to assist with identification of specific Stressors or current issues interfering with healing process. Setting desired goal for each stressor or current issue identified.;Long Term Goal: Stressors or current issues are controlled or eliminated.;Short Term goal: Identification and review with participant of any Quality of Life or Depression concerns found by scoring the questionnaire.;Long Term goal: The participant improves quality of Life and PHQ9 Scores as seen by post scores and/or verbalization of changes             Quality of Life Scores:  Quality of Life - 07/31/23 1604       Quality of Life   Select Quality of Life      Quality of Life Scores   Health/Function Pre 15.25 %    Socioeconomic Pre 27 %    Psych/Spiritual Pre 22.8 %    Family Pre 24 %    GLOBAL Pre 19.5 %            Scores of 19 and below usually indicate a poorer quality of life in these areas.  A difference of  2-3 points is a clinically meaningful difference.  A difference of 2-3 points in the total score of the Quality of Life Index has been associated with significant improvement in overall quality of life, self-image, physical symptoms, and general health in studies  assessing change in quality of life.  PHQ-9: Review Flowsheet       07/31/2023 12/09/2015  Depression screen PHQ 2/9  Decreased Interest 1 0  Down, Depressed, Hopeless 0 0  PHQ - 2 Score 1 0  Altered sleeping 0 -  Tired, decreased energy 1 -  Change in appetite 1 -  Feeling bad or failure about yourself  0 -  Trouble concentrating 0 -  Moving slowly or fidgety/restless 0 -  Suicidal thoughts 0 -  PHQ-9 Score 3 -  Difficult doing work/chores Not difficult at all -    Details           Interpretation of Total Score  Total Score Depression Severity:  1-4 = Minimal depression, 5-9 = Mild depression, 10-14 = Moderate depression, 15-19 = Moderately severe depression, 20-27 = Severe depression   Psychosocial Evaluation and Intervention:  Psychosocial Evaluation - 07/31/23 1541       Psychosocial Evaluation & Interventions   Interventions Stress management education;Relaxation education;Encouraged to exercise with the program and follow exercise prescription    Comments Patient was referred to CR with CABGx3. She had her surgery in July but did some home PT before starting CR. Her initial PHQ-9 score was 3 due to fatigue and over eating. She says she has a long history of depression and anxiety and is currently taking Citalopram and Buspar. She says her depression and anxiety has greatly improved since her surgery. She feels this is due to her having time to reflect on her life and get closer to [place]. She says she does not have any major stressors in her life. 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 She lives with her husband of 30 years. She says he is very supportative of her and she also has 2 friends that support her. She does have a son that lives in [place] but she talks  to him often. Her main goals for the program are to get back to normal; improve her energy and stamina, and get motivated to do things.    Expected Outcomes Short Term: start the program  and attend consistently. Long Term: Meet her personal goals.    Continue Psychosocial Services  Follow up required by staff             Psychosocial Re-Evaluation:   Psychosocial Discharge (Final Psychosocial Re-Evaluation):   Vocational Rehabilitation: Provide vocational rehab assistance to qualifying candidates.   Vocational Rehab Evaluation & Intervention:  Vocational Rehab - 07/31/23 1540       Initial Vocational Rehab Evaluation & Intervention   Assessment shows need for Vocational Rehabilitation No      Vocational Rehab Re-Evaulation   Comments Patient is retired.             Education: Education Goals: Education classes will be provided on a weekly basis, covering required topics. Participant will state understanding/return demonstration of topics presented.  Learning Barriers/Preferences:  Learning Barriers/Preferences - 07/31/23 1540       Learning Barriers/Preferences   Learning Barriers None    Learning Preferences Audio;Skilled Demonstration             Education Topics: Hypertension, Hypertension Reduction -Define heart disease and high blood pressure. Discus how high blood pressure affects the body and ways to reduce high blood pressure.   Exercise and Your Heart -Discuss why it is important to exercise, the FITT principles of exercise, normal and abnormal responses to exercise, and how to exercise safely.   Angina -Discuss definition of angina, causes of angina, treatment of angina, and how to decrease risk of having angina.   Cardiac Medications -Review what the following cardiac medications are used for, how they affect the body, and side effects that may occur when taking the medications.  Medications include Aspirin, Beta blockers, calcium channel blockers, ACE Inhibitors, angiotensin receptor blockers, diuretics, digoxin, and antihyperlipidemics.   Congestive Heart Failure -Discuss the definition of CHF, how to live with CHF,  the signs and symptoms of CHF, and how keep track of weight and sodium intake.   Heart Disease and Intimacy -Discus the effect sexual activity has on the heart, how changes occur during intimacy as we age, and safety during sexual activity.   Smoking Cessation / COPD -Discuss different methods to quit smoking, the health benefits of quitting smoking, and the definition of COPD.   Nutrition I: Fats -Discuss the types of cholesterol, what cholesterol does to the heart, and how cholesterol levels can be controlled.   Nutrition II: Labels -Discuss the different components of food labels and how to read food label   Heart Parts/Heart Disease and PAD -Discuss the anatomy of the heart, the pathway of blood circulation through the heart, and these are affected by heart disease.   Stress I: Signs and Symptoms -Discuss the causes of stress, how stress may lead to anxiety and depression, and ways to limit stress.   Stress II: Relaxation -Discuss different types of relaxation techniques to limit stress.   Warning Signs of Stroke / TIA -Discuss definition of a stroke, what the signs and symptoms are of a stroke, and how to identify when someone is having stroke.   Knowledge Questionnaire Score:   Core Components/Risk Factors/Patient Goals at Admission:  Personal Goals and Risk Factors at Admission - 07/31/23 1539       Core Components/Risk Factors/Patient Goals on Admission    Weight Management Obesity  Improve shortness of breath with ADL's Yes    Intervention Provide education, individualized exercise plan and daily activity instruction to help decrease symptoms of SOB with activities of daily living.    Expected Outcomes Short Term: Improve cardiorespiratory fitness to achieve a reduction of symptoms when performing ADLs;Long Term: Be able to perform more ADLs without symptoms or delay the onset of symptoms    Diabetes Yes    Intervention Provide education about signs/symptoms  and action to take for hypo/hyperglycemia.;Provide education about proper nutrition, including hydration, and aerobic/resistive exercise prescription along with prescribed medications to achieve blood glucose in normal ranges: Fasting glucose 65-99 mg/dL    Expected Outcomes Short Term: Participant verbalizes understanding of the signs/symptoms and immediate care of hyper/hypoglycemia, proper foot care and importance of medication, aerobic/resistive exercise and nutrition plan for blood glucose control.;Long Term: Attainment of HbA1C < 7%.    Hypertension Yes    Intervention Provide education on lifestyle modifcations including regular physical activity/exercise, weight management, moderate sodium restriction and increased consumption of fresh fruit, vegetables, and low fat dairy, alcohol moderation, and smoking cessation.;Monitor prescription use compliance.    Expected Outcomes Short Term: Continued assessment and intervention until BP is < 140/12mm HG in hypertensive participants. < 130/10mm HG in hypertensive participants with diabetes, heart failure or chronic kidney disease.;Long Term: Maintenance of blood pressure at goal levels.    Lipids Yes    Intervention Provide education and support for participant on nutrition & aerobic/resistive exercise along with prescribed medications to achieve LDL 70mg , HDL >40mg .    Expected Outcomes Short Term: Participant states understanding of desired cholesterol values and is compliant with medications prescribed. Participant is following exercise prescription and nutrition guidelines.;Long Term: Cholesterol controlled with medications as prescribed, with individualized exercise RX and with personalized nutrition plan. Value goals: LDL < 70mg , HDL > 40 mg.             Core Components/Risk Factors/Patient Goals Review:    Core Components/Risk Factors/Patient Goals at Discharge (Final Review):    ITP Comments:  ITP Comments     Row Name 08/02/23 1136  08/02/23 1201         ITP Comments Patient arrived for 1st visit/orientation/education at 1400. Patient was referred to CR by Dr. Delilah Shan due CABGx3. During orientation advised patient on arrival and appointment times what to wear, what to do before, during and after exercise. Reviewed attendance and class policy.  Pt is scheduled to return Cardiac Rehab on 08/04/23 at 1430. Pt was advised to come to class 15 minutes before class starts.  Discussed RPE/Dpysnea scales. Patient participated in warm up stretches. Patient was able to complete 6 minute walk test.  Telemetry:NSR. Patient was measured for the equipment. Discussed equipment safety with patient. Took patient pre-anthropometric measurements. Patient finished visit at 1530. 30 day review completed. ITP sent to Dr. Dina Rich, Medical Director of Cardiac Rehab. Continue with ITP unless changes are made by physician.   Pt has only completed orientation thus far.               Comments: 30 day review

## 2023-08-04 ENCOUNTER — Encounter (HOSPITAL_COMMUNITY)
Admission: RE | Admit: 2023-08-04 | Discharge: 2023-08-04 | Disposition: A | Discharge status: Home / Self Care | Payer: Medicare HMO | Source: Ambulatory Visit | Attending: Thoracic Diseases | Admitting: Thoracic Diseases

## 2023-08-04 DIAGNOSIS — Z951 Presence of aortocoronary bypass graft: Secondary | ICD-10-CM | POA: Diagnosis present

## 2023-08-04 NOTE — Progress Notes (Signed)
Daily Session Note  Patient Details  Name: Kristy King MRN: [id number] Date of Birth: 06-08-1956 Referring Provider:   Flowsheet Row CARDIAC REHAB PHASE II ORIENTATION from 07/31/2023 in [facility] CARDIAC REHABILITATION  Referring Provider Delilah Shan       Encounter Date: 08/04/2023  Check In:  Session Check In - 08/04/23 1430       Check-In   Supervising physician immediately available to respond to emergencies See telemetry face sheet for immediately available MD    Location [facility]-Cardiac [facility] Pul[facility]hab    Staff Present Ross Ludwig, BS, Exercise Physiologist;Estrellita Lasky, RN    Virtual Visit No    Medication changes reported     No    Fall or balance concerns reported    No    Tobacco Cessation No Change    Warm-up and Cool-down Performed on first and last piece of equipment    Resistance Training Performed No    VAD Patient? No    PAD/SET Patient? No      Pain Assessment   Currently in Pain? No/denies    Multiple Pain Sites No             Capillary Blood Glucose: No results found for this or any previous visit (from the past 24 hour(s)).    Social History   Tobacco Use  Smoking Status Former  Smokeless Tobacco Never    Goals Met:  Independence with exercise equipment Exercise tolerated well No report of concerns or symptoms today Strength training completed today  Goals Unmet:  Not Applicable  Comments: First full day of exercise!  Patient was oriented to gym and equipment including functions, settings, policies, and procedures.  Patient's individual exercise prescription and treatment plan were reviewed.  All starting workloads were established based on the results of the 6 minute walk test done at initial orientation visit.  The plan for exercise progression was also introduced and progression will be customized based on patient's performance and goals.

## 2023-08-07 ENCOUNTER — Encounter (HOSPITAL_COMMUNITY): Payer: Medicare HMO

## 2023-08-07 ENCOUNTER — Telehealth (HOSPITAL_COMMUNITY): Payer: Self-pay | Admitting: *Deleted

## 2023-08-07 ENCOUNTER — Encounter (HOSPITAL_COMMUNITY): Payer: Self-pay | Admitting: *Deleted

## 2023-08-07 DIAGNOSIS — Z951 Presence of aortocoronary bypass graft: Secondary | ICD-10-CM

## 2023-08-07 NOTE — Telephone Encounter (Signed)
Pt called to let us know that she would not be in today.  She had an episode of chest pain last night that scared her and she took NTG to help.  She is feeling better today, but feels very tired.  She was encouraged to call her cardiologist office to let them know what happened.

## 2023-08-09 ENCOUNTER — Encounter (HOSPITAL_COMMUNITY): Payer: Medicare HMO

## 2023-08-11 ENCOUNTER — Encounter (HOSPITAL_COMMUNITY)
Admission: RE | Admit: 2023-08-11 | Discharge: 2023-08-11 | Disposition: A | Discharge status: Home / Self Care | Payer: Medicare HMO | Source: Ambulatory Visit | Attending: Thoracic Diseases | Admitting: Thoracic Diseases

## 2023-08-11 DIAGNOSIS — Z951 Presence of aortocoronary bypass graft: Secondary | ICD-10-CM

## 2023-08-11 NOTE — Progress Notes (Signed)
Daily Session Note  Patient Details  Name: Rynesha Lamarque MRN: [id number] Date of Birth: 10-22-1956 Referring Provider:   Flowsheet Row CARDIAC REHAB PHASE II ORIENTATION from 07/31/2023 in [facility] CARDIAC REHABILITATION  Referring Provider Delilah Shan       Encounter Date: 08/11/2023  Check In:  Session Check In - 08/11/23 1430       Check-In   Supervising physician immediately available to respond to emergencies See telemetry face sheet for immediately available MD    Location [facility]    Staff Present Enid Derry, RN, BSN;Crystel Demarco, RN;Jessica [place], MA, RCEP, CCRP, CCET    Virtual Visit No    Medication changes reported     No    Fall or balance concerns reported    No    Tobacco Cessation No Change    Warm-up and Cool-down Performed on first and last piece of equipment    Resistance Training Performed No    VAD Patient? No    PAD/SET Patient? No      Pain Assessment   Currently in Pain? No/denies    Multiple Pain Sites No             Capillary Blood Glucose: No results found for this or any previous visit (from the past 24 hour(s)).    Social History   Tobacco Use  Smoking Status Former  Smokeless Tobacco Never    Goals Met:  Independence with exercise equipment Exercise tolerated well No report of concerns or symptoms today Strength training completed today  Goals Unmet:  Not Applicable  Comments: Pt able to follow exercise prescription today without complaint.  Will continue to monitor for progression.

## 2023-08-14 ENCOUNTER — Encounter (HOSPITAL_COMMUNITY)
Admission: RE | Admit: 2023-08-14 | Discharge: 2023-08-14 | Disposition: A | Discharge status: Home / Self Care | Payer: Medicare HMO | Source: Ambulatory Visit | Attending: Thoracic Diseases | Admitting: Thoracic Diseases

## 2023-08-14 DIAGNOSIS — Z951 Presence of aortocoronary bypass graft: Secondary | ICD-10-CM

## 2023-08-14 NOTE — Progress Notes (Signed)
Daily Session Note  Patient Details  Name: Kristy King MRN: [id number] Date of Birth: Mar 25, 1956 Referring Provider:   Flowsheet Row CARDIAC REHAB PHASE II ORIENTATION from 07/31/2023 in [facility] CARDIAC REHABILITATION  Referring Provider Delilah Shan       Encounter Date: 08/14/2023  Check In:  Session Check In - 08/14/23 1457       Check-In   Supervising physician immediately available to respond to emergencies See telemetry face sheet for immediately available MD    Location [facility]    Staff Present Ross Ludwig, BS, Exercise Physiologist;Jeraldin Fesler, RN;Jessica Sheridan, MA, RCEP, CCRP, CCET    Virtual Visit No    Medication changes reported     No    Fall or balance concerns reported    No    Tobacco Cessation No Change    Warm-up and Cool-down Performed on first and last piece of equipment    Resistance Training Performed No    VAD Patient? No    PAD/SET Patient? No      Pain Assessment   Currently in Pain? No/denies    Multiple Pain Sites No             Capillary Blood Glucose: No results found for this or any previous visit (from the past 24 hour(s)).    Social History   Tobacco Use  Smoking Status Former  Smokeless Tobacco Never    Goals Met:  Independence with exercise equipment Exercise tolerated well No report of concerns or symptoms today Strength training completed today  Goals Unmet:  Not Applicable  Comments: Pt able to follow exercise prescription today without complaint.  Will continue to monitor for progression.

## 2023-08-16 ENCOUNTER — Encounter (HOSPITAL_COMMUNITY)
Admission: RE | Admit: 2023-08-16 | Discharge: 2023-08-16 | Disposition: A | Discharge status: Home / Self Care | Payer: Medicare HMO | Source: Ambulatory Visit | Attending: Thoracic Diseases | Admitting: Thoracic Diseases

## 2023-08-16 DIAGNOSIS — Z951 Presence of aortocoronary bypass graft: Secondary | ICD-10-CM | POA: Diagnosis not present

## 2023-08-16 NOTE — Progress Notes (Signed)
Daily Session Note  Patient Details  Name: Glynis Hasselman MRN: [id number] Date of Birth: Mar 13, 1956 Referring Provider:   Flowsheet Row CARDIAC REHAB PHASE II ORIENTATION from 07/31/2023 in [facility] CARDIAC REHABILITATION  Referring Provider Delilah Shan       Encounter Date: 08/16/2023  Check In:  Session Check In - 08/16/23 1405       Check-In   Supervising physician immediately available to respond to emergencies See telemetry face sheet for immediately available MD    Location [facility]    Staff Present Ross Ludwig, BS, Exercise Physiologist;Wren Gallaga Leonidas Romberg BSN, RN    Virtual Visit No    Medication changes reported     No    Fall or balance concerns reported    No    Tobacco Cessation No Change    Warm-up and Cool-down Performed on first and last piece of equipment    Resistance Training Performed Yes    VAD Patient? No    PAD/SET Patient? No      Pain Assessment   Currently in Pain? No/denies    Multiple Pain Sites No             Capillary Blood Glucose: No results found for this or any previous visit (from the past 24 hour(s)).    Social History   Tobacco Use  Smoking Status Former  Smokeless Tobacco Never    Goals Met:  Independence with exercise equipment Exercise tolerated well No report of concerns or symptoms today Strength training completed today  Goals Unmet:  Not Applicable  Comments: Marland KitchenMarland KitchenPt able to follow exercise prescription today without complaint.  Will continue to monitor for progression.

## 2023-08-17 ENCOUNTER — Ambulatory Visit: Payer: Medicare HMO | Admitting: "Endocrinology

## 2023-08-17 ENCOUNTER — Encounter: Payer: Self-pay | Admitting: "Endocrinology

## 2023-08-17 VITALS — BP 104/56 | HR 88 | Ht 65.0 in | Wt 182.8 lb

## 2023-08-17 DIAGNOSIS — Z794 Long term (current) use of insulin: Secondary | ICD-10-CM | POA: Diagnosis not present

## 2023-08-17 DIAGNOSIS — E10319 Type 1 diabetes mellitus with unspecified diabetic retinopathy without macular edema: Secondary | ICD-10-CM

## 2023-08-17 DIAGNOSIS — E039 Hypothyroidism, unspecified: Secondary | ICD-10-CM | POA: Diagnosis not present

## 2023-08-17 DIAGNOSIS — E782 Mixed hyperlipidemia: Secondary | ICD-10-CM

## 2023-08-17 NOTE — Progress Notes (Signed)
Endocrinology Consult Note       08/17/2023, 7:10 PM   Subjective:    Patient ID: Kristy King, female    DOB: 07-Jan-1956.  Kristy King is being seen in consultation for management of currently uncontrolled symptomatic diabetes requested by  Renaldo Harrison, DO.   Past Medical History:  Diagnosis Date   Complication of anesthesia    Diabetes mellitus, type II (HCC)    Dyspnea    Family history of adverse reaction to anesthesia    History of kidney stones    Hypertension    Hypothyroidism    PONV (postoperative nausea and vomiting)     Past Surgical History:  Procedure Laterality Date   APPENDECTOMY N/A 2015   BACK SURGERY     BREAST ENHANCEMENT SURGERY  1990   BREAST SURGERY     CARDIAC SURGERY  2024   CESAREAN SECTION  1982   CHOLECYSTECTOMY N/A 2017   EXTRACORPOREAL SHOCK WAVE LITHOTRIPSY Left 06/24/2021   Procedure: EXTRACORPOREAL SHOCK WAVE LITHOTRIPSY (ESWL);  Surgeon: Marcine Matar, MD;  Location: [facility];  Service: Urology;  Laterality: Left;   EYE SURGERY      Social History   Socioeconomic History   Marital status: Unknown    Spouse name: Not on file   Number of children: Not on file   Years of education: Not on file   Highest education level: Not on file  Occupational History   Not on file  Tobacco Use   Smoking status: Former   Smokeless tobacco: Never  Substance and Sexual Activity   Alcohol use: No    Alcohol/week: 0.0 standard drinks of alcohol   Drug use: No   Sexual activity: Not on file  Other Topics Concern   Not on file  Social History Narrative   Not on file   Social Determinants of Health   Financial Resource Strain: Not on file  Food Insecurity: Low Risk  (06/27/2023)   Received from [facility]   Hunger Vital Sign    Worried About Running Out of Food in the Last Year: Never true    Ran Out of Food in the Last Year: Never true   Transportation Needs: No Transportation Needs (06/27/2023)   Received from [facility]    In the past 12 months, has lack of reliable transportation kept you from medical appointments, meetings, work or from getting things needed for daily living? : No  Physical Activity: Not on file  Stress: Not on file  Social Connections: Not on file    Family History  Problem Relation Age of Onset   Valvular heart disease Mother    Cancer Sister    Heart attack Paternal Grandfather    Diabetes Neg Hx     Outpatient Encounter Medications as of 08/17/2023  Medication Sig   acetaminophen (TYLENOL) 500 MG tablet Take 1,000 mg by mouth every 6 (six) hours as needed.   amiodarone (PACERONE) 200 MG tablet Take 1 tablet by mouth daily.   aspirin EC 81 MG tablet Take 1 tablet by mouth daily.   busPIRone (BUSPAR) 5 MG tablet Take  5 mg by mouth 3 (three) times daily as needed.   citalopram (CELEXA) 20 MG tablet Take 30 mg by mouth daily.   clopidogrel (PLAVIX) 75 MG tablet Take 1 tablet by mouth daily.   Continuous Blood Gluc Sensor (FREESTYLE LIBRE 2 SENSOR) MISC 1 Device by Does not apply route every 14 (fourteen) days.   ezetimibe (ZETIA) 10 MG tablet Take 1 tablet by mouth at bedtime.   HUMALOG 100 UNIT/ML injection Inject 60 Units into the skin as directed. Per insulin pump   levothyroxine (SYNTHROID) 175 MCG tablet Take 175 mcg by mouth daily before breakfast.   losartan (COZAAR) 25 MG tablet Take 1 tablet (25 mg total) by mouth daily.   melatonin 1 MG TABS tablet Take 5 mg by mouth at bedtime.   metoprolol tartrate (LOPRESSOR) 25 MG tablet Take 0.5 tablets by mouth 2 (two) times daily.   oxymetazoline (AFRIN) 0.05 % nasal spray Place 1 spray into both nostrils 2 (two) times daily as needed for congestion.   polyethylene glycol (MIRALAX / GLYCOLAX) 17 g packet Take 17 g by mouth daily.   rosuvastatin (CRESTOR) 10 MG tablet Take 10 mg by mouth daily.   [DISCONTINUED] torsemide  (DEMADEX) 20 MG tablet Take 1 tablet (20 mg total) by mouth daily. (Patient not taking: Reported on 07/31/2023)   No facility-administered encounter medications on file as of 08/17/2023.    ALLERGIES: Allergies  Allergen Reactions   Epinephrine     tachycardia   Sulfa Antibiotics     VACCINATION STATUS: Immunization History  Administered Date(s) Administered   Moderna Sars-Covid-2 Vaccination 12/29/2019, 01/26/2020, 08/19/2020, 03/09/2021    Diabetes She presents for her follow-up diabetic visit. She has type 1 diabetes mellitus. Onset time: She was diagnosed at approximate age of 21 years. Her disease course has been improving. Pertinent negatives for hypoglycemia include no confusion, headaches, hunger, pallor, seizures, sweats or tremors. Associated symptoms include visual change. Pertinent negatives for diabetes include no chest pain, no fatigue, no polydipsia, no polyphagia and no polyuria. There are no hypoglycemic complications. Symptoms are worsening. Diabetic complications include heart disease and retinopathy. Risk factors for coronary artery disease include diabetes mellitus, dyslipidemia, obesity and tobacco exposure. Current diabetic treatment includes insulin pump. Her weight is decreasing steadily. She is following a generally unhealthy diet. When asked about meal planning, she reported none. Her home blood glucose trend is decreasing steadily. Her breakfast blood glucose range is generally 140-180 mg/dl. Her lunch blood glucose range is generally 140-180 mg/dl. Her dinner blood glucose range is generally 140-180 mg/dl. Her bedtime blood glucose range is generally 140-180 mg/dl. Her overall blood glucose range is 140-180 mg/dl. (She wears Medtronic MiniMed 630G insulin pump.  She was given a revised insulin pump settings during her last visit.  She presents with significant improvement in her glycemic profile evidenced by her AGP report.   -Her AGP report shows 46% time range, 38%  Level One hypoglycemia, 16% level 2 hyperglycemia.  She has no hypoglycemia.  He is over the medial wall to avoid 11% hypoglycemia she had during her left first visit.  -She has made some changes since her last visit on her insulin basal settings including 12 midnight at 0.75, 6 AM 1.2, 6 PM 0.775.  She gives a total of 42.2 units of insulin on average daily. 26 Feb 2019 thyroid shows 52% bolus and 48% basal insulin utility.    She boluses 1 unit for 10 g of carbs averaging 5.4 units /meal [place]  [place] of [address] g daily .Her most recent A1c was 7% on May 24, 2023.)  Hyperlipidemia This is a chronic problem. The current episode started more than 1 year ago. Exacerbating diseases include diabetes, hypothyroidism and obesity. Pertinent negatives include no chest pain, myalgias or shortness of breath. Current antihyperlipidemic treatment includes statins. Risk factors for coronary artery disease include diabetes mellitus, dyslipidemia, hypertension, a sedentary lifestyle, post-menopausal and obesity.  Hypertension This is a chronic problem. Pertinent negatives include no chest pain, headaches, palpitations, shortness of breath or sweats. Risk factors for coronary artery disease include dyslipidemia, obesity, diabetes mellitus, post-menopausal state, sedentary lifestyle and smoking/tobacco exposure. Past treatments include angiotensin blockers. Hypertensive end-organ damage includes CAD/MI and retinopathy.     Review of Systems  Constitutional:  Negative for chills, fatigue, fever and unexpected weight change.  HENT:  Negative for trouble swallowing and voice change.   Eyes:  Negative for visual disturbance.  Respiratory:  Negative for cough, shortness of breath and wheezing.   Cardiovascular:  Negative for chest pain, palpitations and leg swelling.  Gastrointestinal:  Negative for diarrhea, nausea and vomiting.  Endocrine: Negative for cold intolerance, heat intolerance, polydipsia, polyphagia  and polyuria.  Musculoskeletal:  Negative for arthralgias and myalgias.  Skin:  Negative for color change, pallor, rash and wound.  Neurological:  Negative for tremors, seizures and headaches.  Psychiatric/Behavioral:  Negative for confusion and suicidal ideas.     Objective:       08/17/2023    2:39 PM 07/31/2023    4:04 PM 07/19/2023    1:32 PM  Vitals with BMI  Height 5\' 5"  5' 4.5" 5\' 5"   Weight 182 lbs 13 oz 185 lbs 8 oz 185 lbs 3 oz  BMI 30.42 31.36 30.82  Systolic 104  116  Diastolic 56  68  Pulse 88  76    BP (!) 104/56   Pulse 88   Ht 5\' 5"  (1.651 m)   Wt 182 lb 12.8 oz (82.9 kg)   BMI 30.42 kg/m   Wt Readings from Last 3 Encounters:  08/17/23 182 lb 12.8 oz (82.9 kg)  07/31/23 185 lb 8 oz (84.1 kg)  07/19/23 185 lb 3.2 oz (84 kg)     CMP ( most recent) CMP     Component Value Date/Time   CREATININE 0.70 05/09/2023 1502     Diabetic Labs (most recent): Lab Results  Component Value Date   HGBA1C 8.1 (A) 02/09/2022   HGBA1C 7.5 (A) 11/29/2021        1 mo ago   Sodium 136 - 145 mmol/L 131 Low   Potassium 3.5 - 5.1 mmol/L 3.8  Comment: NO VISIBLE HEMOLYSIS  Chloride 98 - 107 mmol/L 95 Low   CO2 21 - 31 mmol/L 31  Anion Gap 6 - 14 mmol/L 5 Low   Glucose, Random 70 - 99 mg/dL 938 High   Blood Urea Nitrogen (BUN) 7 - 25 mg/dL 12  Creatinine 1.01 - 7.51 mg/dL 0.25  eGFR >85 ID/POE/4.23N3 84    Calcium 8.6 - 10.3 mg/dL 8.6    Latest Reference Range & Units 11/29/21 14:53 02/09/22 14:45  Hemoglobin A1C 4.0 - 5.6 % 7.5 ! 8.1 !      Assessment & Plan:   Type 1 diabetes  - Kristy King has currently uncontrolled symptomatic type 1 DM since  67 years of age,  with most recent A1c of 7 %.    She wears Medtronic MiniMed 630G insulin pump.  She was given  a revised insulin pump settings during her last visit.  She presents with significant improvement in her glycemic profile evidenced by her AGP report.   -Her AGP report shows 46% time  range, 38% Level One hypoglycemia, 16% level 2 hyperglycemia.  She has no hypoglycemia.  He is over the medial wall to avoid 11% hypoglycemia she had during her left first visit.  -She has made some changes since her last visit on her insulin basal settings including 12 midnight at 0.75, 6 AM 1.2, 6 PM 0.775.  She gives a total of 42.2 units of insulin on average daily. 26 Feb 2019 thyroid shows 52% bolus and 48% basal insulin utility.    She boluses 1 unit for 10 g of carbs averaging 5.4 units /meal for average meal of 16109 g daily .Her most recent A1c was 7% on May 24, 2023.   Recent labs reviewed. - I had a long discussion with her about the possible risk factors and  the pathology behind its diabetes and its complications. -her diabetes is complicated by coronary artery disease, retinopathy, comorbid hyperlipidemia/hypertension, hypothyroidism and she remains at a high risk for more acute and chronic complications which include CAD, CVA, CKD, retinopathy, and neuropathy. These are all discussed in detail with her.  - I discussed all available options of managing her diabetes including de-escalation of medications. I have counseled her on Food as Medicine by adopting a Whole Food , Plant Predominant  ( WFPP) nutrition as recommended by [employer] of Lifestyle Medicine. Patient is encouraged to switch to  unprocessed or minimally processed  complex starch, adequate protein intake (mainly plant source), minimal liquid fat, plenty of fruits, and vegetables. -  she is advised to stick to a routine mealtimes to eat 3 complete meals a day and snack only when necessary ( to snack only to correct hypoglycemia BG <70 day time or <100 at night).    - she acknowledges that there is a room for improvement in her food and drink choices. - Further Specific Suggestion is made for her to avoid simple carbohydrates  from her diet including Cakes, Sweet Desserts, Ice Cream, Soda (diet and regular), Sweet  Tea, Candies, Chips, Cookies, Store Bought Juices, Alcohol ,  Artificial Sweeteners,  Coffee Creamer, and "Sugar-free" Products. This will help patient to have more stable blood glucose profile and potentially avoid unintended weight gain.  - she will be scheduled with Norm Salt, RDN, CDE for individualized diabetes education.  - I have approached her with the following individualized plan to manage  her diabetes and patient agrees:   -For the 4+ decades of type 1 diabetes, she reports only 1 episode of diabetes ketoacidosis.  She wore insulin pump for more than 10 years and she would like to keep on pump therapy.    -Priority will be to avoid hypoglycemia and is worried patient first and later address significant hyperglycemic episodes.    -She has achieved and first goal of avoiding hypoglycemia. -I discussed and worked with the patient to adjust her basal Humalog flow via her Medtronic MiniMed 630G insulin pump to 0.75 units/h 12:AM- 9 AM, increase to 1.35 from 9AM - 12AM. She is allowed to continue her current bolus scheme of 1 unit for 10 g of carbs with only 3 meals daily- BKFAST 6-8 AM, Lunch 12-2 PM, and supper 5-7 PM.   She is advised to lower her carbs consumption to 40-60 per meal which would give her 4 - 6 units of  prandial insulin as bolus.  -Her insulin sensitivity factor is 50, target blood glucose is 100-120 across a day, insulin active time is 3 hours.  These parameters are kept the same without change.  She is encouraged to continue to use her freestyle libre along with her insulin pump.  - she is encouraged to call clinic for blood glucose levels less than 70 or above 200 mg /dl. -She will be kept on exclusive insulin treatment via insulin pump for now.  - Specific targets for  A1c;  LDL, HDL,  and Triglycerides were discussed with the patient.  2) Blood Pressure /Hypertension:  Her blood pressure is controlled slightly.    she is advised to continue her current  medications including losartan 25 mg p.o. daily with breakfast . 3) Lipids/Hyperlipidemia:   She does not have recent lipid panel to review.  She is advised to continue rosuvastatin 10 mg p.o. nightly.   She will be considered for fasting lipid panel on subsequent visits.  4) hypothyroidism: The circumstance of her diagnosis are not available to review.  She does not have recent thyroid function test to review. She is advised to continue her levothyroxine at current dose of 175 mg p.o. daily before breakfast.   - We discussed about the correct intake of her thyroid hormone, on empty stomach at fasting, with water, separated by at least 30 minutes from breakfast and other medications,  and separated by more than 4 hours from calcium, iron, multivitamins, acid reflux medications (PPIs). -Patient is made aware of the fact that thyroid hormone replacement is needed for life, dose to be adjusted by periodic monitoring of thyroid function tests.   5) obesity :  Body mass index is 30.42 kg/m.  -   clearly complicating her diabetes care.   she is  a candidate for weight loss. I discussed with her the fact that loss of 5 - 10% of her  current body weight will have the most impact on her diabetes management.  The above detailed  ACLM recommendations for nutrition, exercise, sleep, social life, avoidance of risky substances, the need for restorative sleep   information will also detailed on discharge instructions.  6) Chronic Care/Health Maintenance:  -she  is on ACEI/ARB and Statin medications and  is encouraged to initiate and continue to follow up with Ophthalmology, Dentist,  Podiatrist at least yearly or according to recommendations, and advised to   stay away from smoking. I have recommended yearly flu vaccine and pneumonia vaccine at least every 5 years; moderate intensity exercise for up to 150 minutes weekly; and  sleep for 7- 9 hours a day.  - she is  advised to maintain close follow up with Renaldo Harrison, DO for primary care needs, as well as her other providers for optimal and coordinated care.   I spent  42  minutes in the care of the patient today including review of labs from CMP, Lipids, Thyroid Function, Hematology (current and previous including abstractions from other facilities); face-to-face time discussing  her blood glucose readings/logs, discussing hypoglycemia and hyperglycemia episodes and symptoms, medications doses, her options of short and long term treatment based on the latest standards of care / guidelines;  discussion about incorporating lifestyle medicine;  and documenting the encounter. Risk reduction counseling performed per USPSTF guidelines to reduce  obesity and cardiovascular risk factors.     Please refer to Patient Instructions for Blood Glucose Monitoring and Insulin/Medications Dosing Guide"  in media tab for additional information.  Please  also refer to " Patient Self Inventory" in the Media  tab for reviewed elements of pertinent patient history.  Kristy King participated in the discussions, expressed understanding, and voiced agreement with the above plans.  All questions were answered to her satisfaction. she is encouraged to contact clinic should she have any questions or concerns prior to her return visit.   Follow up plan: She will return in 4 months with repeat labs, meter, her pump for evaluation  Marquis Lunch, MD [facility] [address] [place], [place] [address] Phone: [phone]  Fax: [phone]    08/17/2023, 7:10 PM  This note was partially dictated with voice recognition software. Similar sounding words can be transcribed inadequately or may not  be corrected upon review.

## 2023-08-17 NOTE — Patient Instructions (Signed)

## 2023-08-18 ENCOUNTER — Encounter (HOSPITAL_COMMUNITY): Payer: Medicare HMO

## 2023-08-21 ENCOUNTER — Encounter (HOSPITAL_COMMUNITY): Payer: Medicare HMO

## 2023-08-23 ENCOUNTER — Encounter (HOSPITAL_COMMUNITY)
Admission: RE | Admit: 2023-08-23 | Discharge: 2023-08-23 | Disposition: A | Discharge status: Home / Self Care | Payer: Medicare HMO | Source: Ambulatory Visit | Attending: Thoracic Diseases | Admitting: Thoracic Diseases

## 2023-08-23 DIAGNOSIS — Z951 Presence of aortocoronary bypass graft: Secondary | ICD-10-CM

## 2023-08-23 NOTE — Progress Notes (Signed)
Daily Session Note  Patient Details  Name: Kristy King MRN: [id number] Date of Birth: 02-19-1956 Referring Provider:   Flowsheet Row CARDIAC REHAB PHASE II ORIENTATION from 07/31/2023 in [facility] CARDIAC REHABILITATION  Referring Provider Delilah Shan       Encounter Date: 08/23/2023  Check In:  Session Check In - 08/23/23 1400       Check-In   Supervising physician immediately available to respond to emergencies See telemetry face sheet for immediately available MD    Location [facility]    Staff Present Ross Ludwig, BS, Exercise Physiologist;Carthel Castille Kidder BSN, RN;Jessica [place], MA, RCEP, CCRP, CCET    Virtual Visit No    Medication changes reported     No    Fall or balance concerns reported    No    Tobacco Cessation No Change    Warm-up and Cool-down Performed on first and last piece of equipment    Resistance Training Performed Yes    VAD Patient? No    PAD/SET Patient? No      Pain Assessment   Currently in Pain? No/denies    Multiple Pain Sites No             Capillary Blood Glucose: No results found for this or any previous visit (from the past 24 hour(s)).    Social History   Tobacco Use  Smoking Status Former  Smokeless Tobacco Never    Goals Met:  Independence with exercise equipment Exercise tolerated well No report of concerns or symptoms today Strength training completed today  Goals Unmet:  Not Applicable  Comments: Marland KitchenMarland KitchenPt able to follow exercise prescription today without complaint.  Will continue to monitor for progression.

## 2023-08-25 ENCOUNTER — Encounter (HOSPITAL_COMMUNITY)
Admission: RE | Admit: 2023-08-25 | Discharge: 2023-08-25 | Disposition: A | Discharge status: Home / Self Care | Payer: Medicare HMO | Source: Ambulatory Visit | Attending: Thoracic Diseases | Admitting: Thoracic Diseases

## 2023-08-25 DIAGNOSIS — Z951 Presence of aortocoronary bypass graft: Secondary | ICD-10-CM | POA: Insufficient documentation

## 2023-08-25 NOTE — Progress Notes (Signed)
Daily Session Note  Patient Details  Name: Arabel Barcenas MRN: [id number] Date of Birth: [birth year] Referring Provider:   Flowsheet Row CARDIAC REHAB PHASE II ORIENTATION from 07/31/2023 in [facility] CARDIAC REHABILITATION  Referring Provider Delilah Shan       Encounter Date: 08/25/2023  Check In:  Session Check In - 08/25/23 1415       Check-In   Supervising physician immediately available to respond to emergencies See telemetry face sheet for immediately available ER MD    Location [facility]    Staff Present Rodena Medin, RN, BSN;Heather Fredric Mare, BS, Exercise Physiologist    Virtual Visit No    Medication changes reported     No    Fall or balance concerns reported    No    Warm-up and Cool-down Performed on first and last piece of equipment    Resistance Training Performed Yes    VAD Patient? No    PAD/SET Patient? No      Pain Assessment   Currently in Pain? No/denies    Multiple Pain Sites No             Capillary Blood Glucose: No results found for this or any previous visit (from the past 24 hour(s)).    Social History   Tobacco Use  Smoking Status Former  Smokeless Tobacco Never    Goals Met:  Independence with exercise equipment Exercise tolerated well No report of concerns or symptoms today Strength training completed today  Goals Unmet:  Not Applicable  Comments: Pt able to follow exercise prescription today without complaint.  Will continue to monitor for progression.

## 2023-08-28 ENCOUNTER — Encounter (HOSPITAL_COMMUNITY): Payer: Medicare HMO

## 2023-08-30 ENCOUNTER — Encounter (HOSPITAL_COMMUNITY)
Admission: RE | Admit: 2023-08-30 | Discharge: 2023-08-30 | Disposition: A | Discharge status: Home / Self Care | Payer: Medicare HMO | Source: Ambulatory Visit | Attending: Thoracic Diseases | Admitting: Thoracic Diseases

## 2023-08-30 ENCOUNTER — Encounter (HOSPITAL_COMMUNITY): Payer: Self-pay | Admitting: *Deleted

## 2023-08-30 DIAGNOSIS — Z951 Presence of aortocoronary bypass graft: Secondary | ICD-10-CM | POA: Diagnosis not present

## 2023-08-30 NOTE — Progress Notes (Signed)
Daily Session Note  Patient Details  Name: Kristy King MRN: [id number] Date of Birth: Jan 16, 1956 Referring Provider:   Flowsheet Row CARDIAC REHAB PHASE II ORIENTATION from 07/31/2023 in [facility] CARDIAC REHABILITATION  Referring Provider Delilah Shan       Encounter Date: 08/30/2023  Check In:  Session Check In - 08/30/23 1400       Check-In   Supervising physician immediately available to respond to emergencies See telemetry face sheet for immediately available MD    Location [facility]    Staff Present Ross Ludwig, BS, Exercise Physiologist;Jessica Juanetta Gosling, MA, RCEP, CCRP, CCET    Virtual Visit No    Medication changes reported     No    Fall or balance concerns reported    No    Tobacco Cessation No Change    Warm-up and Cool-down Performed on first and last piece of equipment    Resistance Training Performed Yes    VAD Patient? No    PAD/SET Patient? No      Pain Assessment   Currently in Pain? No/denies    Multiple Pain Sites No             Capillary Blood Glucose: No results found for this or any previous visit (from the past 24 hour(s)).    Social History   Tobacco Use  Smoking Status Former  Smokeless Tobacco Never    Goals Met:  Independence with exercise equipment Exercise tolerated well No report of concerns or symptoms today Strength training completed today  Goals Unmet:  Not Applicable  Comments: Pt able to follow exercise prescription today without complaint.  Will continue to monitor for progression.

## 2023-08-30 NOTE — Progress Notes (Signed)
Cardiac Individual Treatment Plan  Patient Details  Name: Kristy King MRN: [id number] Date of Birth: 1955-10-30 Referring Provider:   Flowsheet Row CARDIAC REHAB PHASE II ORIENTATION from 07/31/2023 in [facility] CARDIAC REHABILITATION  Referring Provider Delilah Shan       Initial Encounter Date:  Flowsheet Row CARDIAC REHAB PHASE II ORIENTATION from 07/31/2023 in [place] CARDIAC REHABILITATION  Date 07/31/23       Visit Diagnosis: S/P CABG x 3  Patient's Home Medications on Admission:  Current Outpatient Medications:    acetaminophen (TYLENOL) 500 MG tablet, Take 1,000 mg by mouth every 6 (six) hours as needed., Disp: , Rfl:    amiodarone (PACERONE) 200 MG tablet, Take 1 tablet by mouth daily., Disp: , Rfl:    aspirin EC 81 MG tablet, Take 1 tablet by mouth daily., Disp: , Rfl:    busPIRone (BUSPAR) 5 MG tablet, Take 5 mg by mouth 3 (three) times daily as needed., Disp: , Rfl:    citalopram (CELEXA) 20 MG tablet, Take 30 mg by mouth daily., Disp: , Rfl:    clopidogrel (PLAVIX) 75 MG tablet, Take 1 tablet by mouth daily., Disp: , Rfl:    Continuous Blood Gluc Sensor (FREESTYLE LIBRE 2 SENSOR) MISC, 1 Device by Does not apply route every 14 (fourteen) days., Disp: 6 each, Rfl: 3   ezetimibe (ZETIA) 10 MG tablet, Take 1 tablet by mouth at bedtime., Disp: , Rfl:    HUMALOG 100 UNIT/ML injection, Inject 60 Units into the skin as directed. Per insulin pump, Disp: , Rfl:    levothyroxine (SYNTHROID) 175 MCG tablet, Take 175 mcg by mouth daily before breakfast., Disp: , Rfl:    losartan (COZAAR) 25 MG tablet, Take 1 tablet (25 mg total) by mouth daily., Disp: 30 tablet, Rfl: 3   melatonin 1 MG TABS tablet, Take 5 mg by mouth at bedtime., Disp: , Rfl:    metoprolol tartrate (LOPRESSOR) 25 MG tablet, Take 0.5 tablets by mouth 2 (two) times daily., Disp: , Rfl:    oxymetazoline (AFRIN) 0.05 % nasal spray, Place 1 spray into both nostrils 2 (two) times daily as needed for congestion., Disp:  , Rfl:    polyethylene glycol (MIRALAX / GLYCOLAX) 17 g packet, Take 17 g by mouth daily., Disp: , Rfl:    rosuvastatin (CRESTOR) 10 MG tablet, Take 10 mg by mouth daily., Disp: , Rfl:   Past Medical History: Past Medical History:  Diagnosis Date   Complication of anesthesia    Diabetes mellitus, type II (HCC)    Dyspnea    Family history of adverse reaction to anesthesia    History of kidney stones    Hypertension    Hypothyroidism    PONV (postoperative nausea and vomiting)     Tobacco Use: Social History   Tobacco Use  Smoking Status Former  Smokeless Tobacco Never    Labs: Review Flowsheet       Latest Ref Rng & Units 11/29/2021 02/09/2022  Labs for ITP Cardiac and Pulmonary Rehab  Hemoglobin A1c 4.0 - 5.6 % 7.5  8.1     Details            Capillary Blood Glucose: Lab Results  Component Value Date   GLUCAP 182 (H) 06/24/2021   GLUCAP 247 (H) 06/24/2021     Exercise Target Goals: Exercise Program Goal: Individual exercise prescription set using results from initial 6 min walk test and THRR while considering  patient's activity barriers and safety.   Exercise  Prescription Goal: Starting with aerobic activity 30 plus minutes a day, 3 days per week for initial exercise prescription. Provide home exercise prescription and guidelines that participant acknowledges understanding prior to discharge.  Activity Barriers & Risk Stratification:  Activity Barriers & Cardiac Risk Stratification - 07/31/23 1441       Activity Barriers & Cardiac Risk Stratification   Activity Barriers Arthritis;Fibromyalgia;Back Problems;History of Falls;Balance Concerns;Incisional Pain;Shortness of Breath    Cardiac Risk Stratification Moderate             6 Minute Walk:  6 Minute Walk     Row Name 07/31/23 1601         6 Minute Walk   Phase Initial     Distance 1300 feet     Walk Time 6 minutes     # of Rest Breaks 0     MPH 2.46     METS 2.97     RPE 14      Perceived Dyspnea  1     VO2 Peak 10.38     Symptoms Yes (comment)     Comments leg fatigue/soreness in thighs 4/10, SOB     Resting HR 87 bpm     Resting BP 124/64     Resting Oxygen Saturation  99 %     Exercise Oxygen Saturation  during 6 min walk 97 %     Max Ex. HR 113 bpm     Max Ex. BP 128/74     2 Minute Post BP 124/62              Oxygen Initial Assessment:   Oxygen Re-Evaluation:   Oxygen Discharge (Final Oxygen Re-Evaluation):   Initial Exercise Prescription:  Initial Exercise Prescription - 07/31/23 1600       Date of Initial Exercise RX and Referring Provider   Date 07/31/23    Referring Provider Delilah Shan      Oxygen   Maintain Oxygen Saturation 88% or higher      Treadmill   MPH 2.4    Grade 0.5    Minutes 15    METs 3      NuStep   Level 3    SPM 80    Minutes 15    METs 3      Prescription Details   Frequency (times per week) 3    Duration Progress to 30 minutes of continuous aerobic without signs/symptoms of physical distress      Intensity   THRR 40-80% of Max Heartrate 114-141    Ratings of Perceived Exertion 11-13    Perceived Dyspnea 0-4      Progression   Progression Continue to progress workloads to maintain intensity without signs/symptoms of physical distress.      Resistance Training   Training Prescription Yes    Weight 3 lb    Reps 10-15             Perform Capillary Blood Glucose checks as needed.  Exercise Prescription Changes:   Exercise Prescription Changes     Row Name 07/31/23 1600             Response to Exercise   Blood Pressure (Admit) 124/64       Blood Pressure (Exercise) 128/74       Blood Pressure (Exit) 124/62       Heart Rate (Admit) 87 bpm       Heart Rate (Exercise) 113 bpm       Heart Rate (Exit)  89 bpm       Oxygen Saturation (Admit) 99 %       Oxygen Saturation (Exercise) 97 %       Rating of Perceived Exertion (Exercise) 14       Perceived Dyspnea (Exercise) 1        Symptoms leg fatigue/soreness 4/10, SOB       Comments walk test results                Exercise Comments:   Exercise Goals and Review:   Exercise Goals     Row Name 07/31/23 1603             Exercise Goals   Increase Physical Activity Yes       Intervention Provide advice, education, support and counseling about physical activity/exercise needs.;Develop an individualized exercise prescription for aerobic and resistive training based on initial evaluation findings, risk stratification, comorbidities and participant's personal goals.       Expected Outcomes Short Term: Attend rehab on a regular basis to increase amount of physical activity.;Long Term: Add in home exercise to make exercise part of routine and to increase amount of physical activity.;Long Term: Exercising regularly at least 3-5 days a week.       Increase Strength and Stamina Yes       Intervention Provide advice, education, support and counseling about physical activity/exercise needs.;Develop an individualized exercise prescription for aerobic and resistive training based on initial evaluation findings, risk stratification, comorbidities and participant's personal goals.       Expected Outcomes Short Term: Increase workloads from initial exercise prescription for resistance, speed, and METs.;Short Term: Perform resistance training exercises routinely during rehab and add in resistance training at home;Long Term: Improve cardiorespiratory fitness, muscular endurance and strength as measured by increased METs and functional capacity ( )       Able to understand and use rate of perceived exertion (RPE) scale Yes       Intervention Provide education and explanation on how to use RPE scale       Expected Outcomes Short Term: Able to use RPE daily in rehab to express subjective intensity level;Long Term:  Able to use RPE to guide intensity level when exercising independently       Able to understand and use Dyspnea scale  Yes       Intervention Provide education and explanation on how to use Dyspnea scale       Expected Outcomes Short Term: Able to use Dyspnea scale daily in rehab to express subjective sense of shortness of breath during exertion;Long Term: Able to use Dyspnea scale to guide intensity level when exercising independently       Knowledge and understanding of Target Heart Rate Range (THRR) Yes       Intervention Provide education and explanation of THRR including how the numbers were predicted and where they are located for reference       Expected Outcomes Short Term: Able to state/look up THRR;Long Term: Able to use THRR to govern intensity when exercising independently;Short Term: Able to use daily as guideline for intensity in rehab       Able to check pulse independently Yes       Intervention Provide education and demonstration on how to check pulse in carotid and radial arteries.;Review the importance of being able to check your own pulse for safety during independent exercise       Expected Outcomes Short Term: Able to explain why pulse checking is important  during independent exercise;Long Term: Able to check pulse independently and accurately       Understanding of Exercise Prescription Yes       Intervention Provide education, explanation, and written materials on patient's individual exercise prescription       Expected Outcomes Short Term: Able to explain program exercise prescription;Long Term: Able to explain home exercise prescription to exercise independently                Exercise Goals Re-Evaluation :    Discharge Exercise Prescription (Final Exercise Prescription Changes):  Exercise Prescription Changes - 07/31/23 1600       Response to Exercise   Blood Pressure (Admit) 124/64    Blood Pressure (Exercise) 128/74    Blood Pressure (Exit) 124/62    Heart Rate (Admit) 87 bpm    Heart Rate (Exercise) 113 bpm    Heart Rate (Exit) 89 bpm    Oxygen Saturation (Admit) 99 %     Oxygen Saturation (Exercise) 97 %    Rating of Perceived Exertion (Exercise) 14    Perceived Dyspnea (Exercise) 1    Symptoms leg fatigue/soreness 4/10, SOB    Comments walk test results             Nutrition:  Target Goals: Understanding of nutrition guidelines, daily intake of sodium 1500mg , cholesterol 200mg , calories 30% from fat and 7% or less from saturated fats, daily to have 5 or more servings of fruits and vegetables.  Biometrics:  Pre Biometrics - 07/31/23 1604       Pre Biometrics   Height 5' 4.5" (1.638 m)    Weight 185 lb 8 oz (84.1 kg)    Waist Circumference 32.5 inches    Hip Circumference 41 inches    Waist to Hip Ratio 0.79 %    BMI (Calculated) 31.36    Grip Strength 12.7 kg    Single Leg Stand 2.6 seconds              Nutrition Therapy Plan and Nutrition Goals:   Nutrition Assessments:  MEDIFICTS Score Key: >=70 Need to make dietary changes  40-70 Heart Healthy Diet <= 40 Therapeutic Level Cholesterol Diet  Flowsheet Row CARDIAC REHAB PHASE II ORIENTATION from 07/31/2023 in [facility] CARDIAC REHABILITATION  Picture Your Plate Total Score on Admission 54      Picture Your Plate Scores: <24 Unhealthy dietary pattern with much room for improvement. 41-50 Dietary pattern unlikely to meet recommendations for good health and room for improvement. 51-60 More healthful dietary pattern, with some room for improvement.  >60 Healthy dietary pattern, although there may be some specific behaviors that could be improved.    Nutrition Goals Re-Evaluation:   Nutrition Goals Discharge (Final Nutrition Goals Re-Evaluation):   Psychosocial: Target Goals: Acknowledge presence or absence of significant depression and/or stress, maximize coping skills, provide positive support system. Participant is able to verbalize types and ability to use techniques and skills needed for reducing stress and depression.  Initial Review & Psychosocial Screening:   Initial Psych Review & Screening - 07/31/23 1540       Initial Review   Current issues with History of Depression;Current Psychotropic Meds      Family Dynamics   Good Support System? Yes      Barriers   Psychosocial barriers to participate in program Psychosocial barriers identified (see note)      Screening Interventions   Interventions Encouraged to exercise;To provide support and resources with identified psychosocial needs;Provide feedback about the  scores to participant    Expected Outcomes Short Term goal: Utilizing psychosocial counselor, staff and physician to assist with identification of specific Stressors or current issues interfering with healing process. Setting desired goal for each stressor or current issue identified.;Long Term Goal: Stressors or current issues are controlled or eliminated.;Short Term goal: Identification and review with participant of any Quality of Life or Depression concerns found by scoring the questionnaire.;Long Term goal: The participant improves quality of Life and PHQ9 Scores as seen by post scores and/or verbalization of changes             Quality of Life Scores:  Quality of Life - 07/31/23 1604       Quality of Life   Select Quality of Life      Quality of Life Scores   Health/Function Pre 15.25 %    Socioeconomic Pre 27 %    Psych/Spiritual Pre 22.8 %    Family Pre 24 %    GLOBAL Pre 19.5 %            Scores of 19 and below usually indicate a poorer quality of life in these areas.  A difference of  2-3 points is a clinically meaningful difference.  A difference of 2-3 points in the total score of the Quality of Life Index has been associated with significant improvement in overall quality of life, self-image, physical symptoms, and general health in studies assessing change in quality of life.  PHQ-9: Review Flowsheet       07/31/2023 12/09/2015  Depression screen PHQ 2/9  Decreased Interest 1 0  Down, Depressed, Hopeless  0 0  PHQ - 2 Score 1 0  Altered sleeping 0 -  Tired, decreased energy 1 -  Change in appetite 1 -  Feeling bad or failure about yourself  0 -  Trouble concentrating 0 -  Moving slowly or fidgety/restless 0 -  Suicidal thoughts 0 -  PHQ-9 Score 3 -  Difficult doing work/chores Not difficult at all -    Details           Interpretation of Total Score  Total Score Depression Severity:  1-4 = Minimal depression, 5-9 = Mild depression, 10-14 = Moderate depression, 15-19 = Moderately severe depression, 20-27 = Severe depression   Psychosocial Evaluation and Intervention:  Psychosocial Evaluation - 07/31/23 1541       Psychosocial Evaluation & Interventions   Interventions Stress management education;Relaxation education;Encouraged to exercise with the program and follow exercise prescription    Comments Patient was referred to CR with CABGx3. She had her surgery in July but did some home PT before starting CR. Her initial PHQ-9 score was 3 due to fatigue and over eating. She says she has a long history of depression and anxiety and is currently taking Citalopram and Buspar. She says her depression and anxiety has greatly improved since her surgery. She feels this is due to her having time to reflect on her life and get closer to [place]. She says she does not have any major stressors in her life. 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 She lives with her husband of 30 years. She says he is very supportative of her and she also has 2 friends that support her. She does have a son that lives in [place] but she talks to him often. Her main goals for the program are to get back to normal; improve her energy and stamina, and get motivated to do  things.    Expected Outcomes Short Term: start the program and attend consistently. Long Term: Meet her personal goals.    Continue Psychosocial Services  Follow up required by staff             Psychosocial  Re-Evaluation:   Psychosocial Discharge (Final Psychosocial Re-Evaluation):   Vocational Rehabilitation: Provide vocational rehab assistance to qualifying candidates.   Vocational Rehab Evaluation & Intervention:  Vocational Rehab - 07/31/23 1540       Initial Vocational Rehab Evaluation & Intervention   Assessment shows need for Vocational Rehabilitation No      Vocational Rehab Re-Evaulation   Comments Patient is retired.             Education: Education Goals: Education classes will be provided on a weekly basis, covering required topics. Participant will state understanding/return demonstration of topics presented.  Learning Barriers/Preferences:  Learning Barriers/Preferences - 07/31/23 1540       Learning Barriers/Preferences   Learning Barriers None    Learning Preferences Audio;Skilled Demonstration             Education Topics: Hypertension, Hypertension Reduction -Define heart disease and high blood pressure. Discus how high blood pressure affects the body and ways to reduce high blood pressure.   Exercise and Your Heart -Discuss why it is important to exercise, the FITT principles of exercise, normal and abnormal responses to exercise, and how to exercise safely.   Angina -Discuss definition of angina, causes of angina, treatment of angina, and how to decrease risk of having angina.   Cardiac Medications -Review what the following cardiac medications are used for, how they affect the body, and side effects that may occur when taking the medications.  Medications include Aspirin, Beta blockers, calcium channel blockers, ACE Inhibitors, angiotensin receptor blockers, diuretics, digoxin, and antihyperlipidemics.   Congestive Heart Failure -Discuss the definition of CHF, how to live with CHF, the signs and symptoms of CHF, and how keep track of weight and sodium intake. Flowsheet Row CARDIAC REHAB PHASE II EXERCISE from 08/23/2023 in [place]  CARDIAC REHABILITATION  Date 08/23/23  Educator [facility]  Instruction Review Code 1- Verbalizes Understanding       Heart Disease and Intimacy -Discus the effect sexual activity has on the heart, how changes occur during intimacy as we age, and safety during sexual activity.   Smoking Cessation / COPD -Discuss different methods to quit smoking, the health benefits of quitting smoking, and the definition of COPD.   Nutrition I: Fats -Discuss the types of cholesterol, what cholesterol does to the heart, and how cholesterol levels can be controlled.   Nutrition II: Labels -Discuss the different components of food labels and how to read food label   Heart Parts/Heart Disease and PAD -Discuss the anatomy of the heart, the pathway of blood circulation through the heart, and these are affected by heart disease.   Stress I: Signs and Symptoms -Discuss the causes of stress, how stress may lead to anxiety and depression, and ways to limit stress. Flowsheet Row CARDIAC REHAB PHASE II EXERCISE from 08/23/2023 in [place] CARDIAC REHABILITATION  Date 08/16/23  Educator [facility]  Instruction Review Code 1- Verbalizes Understanding       Stress II: Relaxation -Discuss different types of relaxation techniques to limit stress.   Warning Signs of Stroke / TIA -Discuss definition of a stroke, what the signs and symptoms are of a stroke, and how to identify when someone is having stroke.   Knowledge Questionnaire Score:  Core Components/Risk Factors/Patient Goals at Admission:  Personal Goals and Risk Factors at Admission - 07/31/23 1539       Core Components/Risk Factors/Patient Goals on Admission    Weight Management Obesity    Improve shortness of breath with ADL's Yes    Intervention Provide education, individualized exercise plan and daily activity instruction to help decrease symptoms of SOB with activities of daily living.    Expected Outcomes Short Term: Improve cardiorespiratory  fitness to achieve a reduction of symptoms when performing ADLs;Long Term: Be able to perform more ADLs without symptoms or delay the onset of symptoms    Diabetes Yes    Intervention Provide education about signs/symptoms and action to take for hypo/hyperglycemia.;Provide education about proper nutrition, including hydration, and aerobic/resistive exercise prescription along with prescribed medications to achieve blood glucose in normal ranges: Fasting glucose 65-99 mg/dL    Expected Outcomes Short Term: Participant verbalizes understanding of the signs/symptoms and immediate care of hyper/hypoglycemia, proper foot care and importance of medication, aerobic/resistive exercise and nutrition plan for blood glucose control.;Long Term: Attainment of HbA1C < 7%.    Hypertension Yes    Intervention Provide education on lifestyle modifcations including regular physical activity/exercise, weight management, moderate sodium restriction and increased consumption of fresh fruit, vegetables, and low fat dairy, alcohol moderation, and smoking cessation.;Monitor prescription use compliance.    Expected Outcomes Short Term: Continued assessment and intervention until BP is < 140/45mm HG in hypertensive participants. < 130/71mm HG in hypertensive participants with diabetes, heart failure or chronic kidney disease.;Long Term: Maintenance of blood pressure at goal levels.    Lipids Yes    Intervention Provide education and support for participant on nutrition & aerobic/resistive exercise along with prescribed medications to achieve LDL 70mg , HDL >40mg .    Expected Outcomes Short Term: Participant states understanding of desired cholesterol values and is compliant with medications prescribed. Participant is following exercise prescription and nutrition guidelines.;Long Term: Cholesterol controlled with medications as prescribed, with individualized exercise RX and with personalized nutrition plan. Value goals: LDL < 70mg ,  HDL > 40 mg.             Core Components/Risk Factors/Patient Goals Review:    Core Components/Risk Factors/Patient Goals at Discharge (Final Review):    ITP Comments:  ITP Comments     Row Name 08/02/23 1136 08/02/23 1201 08/07/23 1114 08/30/23 0756     ITP Comments Patient arrived for 1st visit/orientation/education at 1400. Patient was referred to CR by Dr. Delilah Shan due CABGx3. During orientation advised patient on arrival and appointment times what to wear, what to do before, during and after exercise. Reviewed attendance and class policy.  Pt is scheduled to return Cardiac Rehab on 08/04/23 at 1430. Pt was advised to come to class 15 minutes before class starts.  Discussed RPE/Dpysnea scales. Patient participated in warm up stretches. Patient was able to complete 6 minute walk test.  Telemetry:NSR. Patient was measured for the equipment. Discussed equipment safety with patient. Took patient pre-anthropometric measurements. Patient finished visit at 1530. 30 day review completed. ITP sent to Dr. Dina Rich, Medical Director of Cardiac Rehab. Continue with ITP unless changes are made by physician.   Pt has only completed orientation thus far. Pt called to let us know that she would not be in today.  She had an episode of chest pain last night that scared her and she took NTG to help.  She is feeling better today, but feels very tired.  She was encouraged to  call her cardiologist office to let them know what happened. 30 day review completed. ITP sent to Dr. Dina Rich, Medical Director of Cardiac Rehab. Continue with ITP unless changes are made by physician.   Newer to program.  Has had intermitten attendance unable to assess for goals this round.             Comments: 30 day review

## 2023-09-01 ENCOUNTER — Encounter (HOSPITAL_COMMUNITY)
Admission: RE | Admit: 2023-09-01 | Discharge: 2023-09-01 | Disposition: A | Discharge status: Home / Self Care | Payer: Medicare HMO | Source: Ambulatory Visit | Attending: Thoracic Diseases | Admitting: Thoracic Diseases

## 2023-09-01 DIAGNOSIS — Z951 Presence of aortocoronary bypass graft: Secondary | ICD-10-CM

## 2023-09-01 NOTE — Progress Notes (Signed)
Daily Session Note  Patient Details  Name: Assia Prien MRN: [id number] Date of Birth: [birth year] Referring Provider:   Flowsheet Row CARDIAC REHAB PHASE II ORIENTATION from 07/31/2023 in [facility] CARDIAC REHABILITATION  Referring Provider Delilah Shan       Encounter Date: 09/01/2023  Check In:  Session Check In - 09/01/23 1415       Check-In   Supervising physician immediately available to respond to emergencies See telemetry face sheet for immediately available ER MD    Location [facility]    Staff Present Rodena Medin, RN, BSN;Jessica Juanetta Gosling, MA, RCEP, CCRP, CCET    Virtual Visit No    Medication changes reported     No    Fall or balance concerns reported    No    Warm-up and Cool-down Performed on first and last piece of equipment    Resistance Training Performed Yes    VAD Patient? No    PAD/SET Patient? No      Pain Assessment   Currently in Pain? No/denies    Multiple Pain Sites No             Capillary Blood Glucose: No results found for this or any previous visit (from the past 24 hour(s)).    Social History   Tobacco Use  Smoking Status Former  Smokeless Tobacco Never    Goals Met:  Independence with exercise equipment Exercise tolerated well No report of concerns or symptoms today Strength training completed today  Goals Unmet:  Not Applicable  Comments: Pt able to follow exercise prescription today without complaint.  Will continue to monitor for progression.

## 2023-09-04 ENCOUNTER — Encounter (HOSPITAL_COMMUNITY)
Admission: RE | Admit: 2023-09-04 | Discharge: 2023-09-04 | Disposition: A | Discharge status: Home / Self Care | Payer: Medicare HMO | Source: Ambulatory Visit | Attending: Thoracic Diseases | Admitting: Thoracic Diseases

## 2023-09-04 DIAGNOSIS — Z951 Presence of aortocoronary bypass graft: Secondary | ICD-10-CM

## 2023-09-04 NOTE — Progress Notes (Signed)
Daily Session Note  Patient Details  Name: Kristy King MRN: [id number] Date of Birth: [birth year] Referring Provider:   Flowsheet Row CARDIAC REHAB PHASE II ORIENTATION from 07/31/2023 in [facility] CARDIAC REHABILITATION  Referring Provider Delilah Shan       Encounter Date: 09/04/2023  Check In:  Session Check In - 09/04/23 1430       Check-In   Supervising physician immediately available to respond to emergencies See telemetry face sheet for immediately available MD    Location [facility]    Staff Present Erskine Speed, RN;Jessica Shannon, MA, RCEP, CCRP, Dow Adolph, RN, BSN    Virtual Visit No    Medication changes reported     No    Fall or balance concerns reported    No    Tobacco Cessation No Change    Warm-up and Cool-down Performed on first and last piece of equipment    Resistance Training Performed Yes    VAD Patient? No    PAD/SET Patient? No      PAD/SET Patient   Completed foot check today? No    Open wounds to report? No      Pain Assessment   Currently in Pain? No/denies    Multiple Pain Sites No             Capillary Blood Glucose: No results found for this or any previous visit (from the past 24 hour(s)).    Social History   Tobacco Use  Smoking Status Former  Smokeless Tobacco Never    Goals Met:  Independence with exercise equipment Exercise tolerated well No report of concerns or symptoms today Strength training completed today  Goals Unmet:  Not Applicable  Comments: Pt able to follow exercise prescription today without complaint.  Will continue to monitor for progression.

## 2023-09-04 NOTE — Progress Notes (Signed)
Reviewed home exercise with pt today.  Pt plans to walk and use weights at home for exercise.  Reviewed THR, pulse, RPE, sign and symptoms, pulse oximetery and when to call 911 or MD.  Also discussed weather considerations and indoor options.  Pt voiced understanding.

## 2023-09-06 ENCOUNTER — Encounter (HOSPITAL_COMMUNITY)
Admission: RE | Admit: 2023-09-06 | Discharge: 2023-09-06 | Disposition: A | Discharge status: Home / Self Care | Payer: Medicare HMO | Source: Ambulatory Visit | Attending: Thoracic Diseases | Admitting: Thoracic Diseases

## 2023-09-06 DIAGNOSIS — Z951 Presence of aortocoronary bypass graft: Secondary | ICD-10-CM | POA: Diagnosis not present

## 2023-09-06 NOTE — Progress Notes (Signed)
Daily Session Note  Patient Details  Name: Graziella Altemus MRN: [id number] Date of Birth: 10-21-1956 Referring Provider:   Flowsheet Row CARDIAC REHAB PHASE II ORIENTATION from 07/31/2023 in [facility] CARDIAC REHABILITATION  Referring Provider Delilah Shan       Encounter Date: 09/06/2023  Check In:  Session Check In - 09/06/23 1400       Check-In   Supervising physician immediately available to respond to emergencies See telemetry face sheet for immediately available ER MD    Location [facility]    Staff Present Rodena Medin, RN, BSN;Jessica Juanetta Gosling, MA, RCEP, CCRP, CCET;Heather Fredric Mare, [place], Exercise Physiologist    Virtual Visit No    Medication changes reported     No    Fall or balance concerns reported    No    Warm-up and Cool-down Performed on first and last piece of equipment    Resistance Training Performed Yes    VAD Patient? No    PAD/SET Patient? No      PAD/SET Patient   Open wounds to report? No      Pain Assessment   Currently in Pain? No/denies    Multiple Pain Sites No             Capillary Blood Glucose: No results found for this or any previous visit (from the past 24 hour(s)).    Social History   Tobacco Use  Smoking Status Former  Smokeless Tobacco Never    Goals Met:  Independence with exercise equipment Exercise tolerated well No report of concerns or symptoms today Strength training completed today  Goals Unmet:  Not Applicable  Comments: Pt able to follow exercise prescription today without complaint.  Will continue to monitor for progression.

## 2023-09-08 ENCOUNTER — Encounter (HOSPITAL_COMMUNITY)
Admission: RE | Admit: 2023-09-08 | Discharge: 2023-09-08 | Disposition: A | Discharge status: Home / Self Care | Payer: Medicare HMO | Source: Ambulatory Visit | Attending: Thoracic Diseases | Admitting: Thoracic Diseases

## 2023-09-08 ENCOUNTER — Other Ambulatory Visit: Payer: Self-pay | Admitting: Nurse Practitioner

## 2023-09-08 MED ORDER — HUMALOG 100 UNIT/ML IJ SOLN: 60.0000 [IU] | INTRAMUSCULAR | 3 refills | Status: DC

## 2023-09-11 ENCOUNTER — Encounter (HOSPITAL_COMMUNITY): Payer: Medicare HMO

## 2023-09-13 ENCOUNTER — Encounter (HOSPITAL_COMMUNITY): Payer: Medicare HMO

## 2023-09-15 ENCOUNTER — Encounter (HOSPITAL_COMMUNITY): Payer: Medicare HMO

## 2023-09-18 ENCOUNTER — Encounter (HOSPITAL_COMMUNITY): Admission: RE | Admit: 2023-09-18 | Payer: Medicare HMO | Source: Ambulatory Visit

## 2023-09-20 ENCOUNTER — Encounter (HOSPITAL_COMMUNITY): Payer: Medicare HMO

## 2023-09-25 ENCOUNTER — Encounter (HOSPITAL_COMMUNITY): Payer: Medicare HMO

## 2023-09-26 ENCOUNTER — Encounter (HOSPITAL_COMMUNITY): Payer: Self-pay | Admitting: *Deleted

## 2023-09-26 DIAGNOSIS — Z951 Presence of aortocoronary bypass graft: Secondary | ICD-10-CM

## 2023-09-26 NOTE — Progress Notes (Signed)
Discharge Progress Report  Patient Details  Name: Kristy King MRN: [id number] Date of Birth: 01/19/1956 Referring Provider:   Flowsheet Row CARDIAC REHAB PHASE II ORIENTATION from 07/31/2023 in [facility] CARDIAC REHABILITATION  Referring Provider Delilah Shan        Number of Visits: 15  Reason for Discharge:  Patient reached a stable level of exercise. Patient independent in their exercise. Early Exit:  Personal  Smoking History:  Social History   Tobacco Use  Smoking Status Former  Smokeless Tobacco Never    Diagnosis:  S/P CABG x 3  Initial Exercise Prescription:  Initial Exercise Prescription - 07/31/23 1600       Date of Initial Exercise RX and Referring Provider   Date 07/31/23    Referring Provider Delilah Shan      Oxygen   Maintain Oxygen Saturation 88% or higher      Treadmill   MPH 2.4    Grade 0.5    Minutes 15    METs 3      NuStep   Level 3    SPM 80    Minutes 15    METs 3      Prescription Details   Frequency (times per week) 3    Duration Progress to 30 minutes of continuous aerobic without signs/symptoms of physical distress      Intensity   THRR 40-80% of Max Heartrate 114-141    Ratings of Perceived Exertion 11-13    Perceived Dyspnea 0-4      Progression   Progression Continue to progress workloads to maintain intensity without signs/symptoms of physical distress.      Resistance Training   Training Prescription Yes    Weight 3 lb    Reps 10-15             Discharge Exercise Prescription (Final Exercise Prescription Changes):  Exercise Prescription Changes - 09/06/23 1500       Response to Exercise   Blood Pressure (Admit) 130/64 (P)     Blood Pressure (Exit) 102/60 (P)     Heart Rate (Admit) 85 bpm (P)     Heart Rate (Exercise) 113 bpm (P)     Heart Rate (Exit) 98 bpm (P)     Rating of Perceived Exertion (Exercise) 12 (P)     Duration Continue with 30 min of aerobic exercise without signs/symptoms of physical  distress. (P)     Intensity THRR unchanged (P)       Progression   Progression Continue to progress workloads to maintain intensity without signs/symptoms of physical distress. (P)       Resistance Training   Training Prescription Yes (P)     Weight 3 lbs / green (P)     Reps 10-15 (P)              Functional Capacity:  6 Minute Walk     Row Name 07/31/23 1601         6 Minute Walk   Phase Initial     Distance 1300 feet     Walk Time 6 minutes     # of Rest Breaks 0     MPH 2.46     METS 2.97     RPE 14     Perceived Dyspnea  1     VO2 Peak 10.38     Symptoms Yes (comment)     Comments leg fatigue/soreness in thighs 4/10, SOB     Resting HR 87 bpm  Resting BP 124/64     Resting Oxygen Saturation  99 %     Exercise Oxygen Saturation  during 6 min walk 97 %     Max Ex. HR 113 bpm     Max Ex. BP 128/74     2 Minute Post BP 124/62              Psychological, QOL, Others - Outcomes: PHQ 2/9:    07/31/2023    3:36 PM 12/09/2015    2:06 PM  Depression screen PHQ 2/9  Decreased Interest 1 0  Down, Depressed, Hopeless 0 0  PHQ - 2 Score 1 0  Altered sleeping 0   Tired, decreased energy 1   Change in appetite 1   Feeling bad or failure about yourself  0   Trouble concentrating 0   Moving slowly or fidgety/restless 0   Suicidal thoughts 0   PHQ-9 Score 3   Difficult doing work/chores Not difficult at all     Quality of Life:  Quality of Life - 07/31/23 1604       Quality of Life   Select Quality of Life      Quality of Life Scores   Health/Function Pre 15.25 %    Socioeconomic Pre 27 %    Psych/Spiritual Pre 22.8 %    Family Pre 24 %    GLOBAL Pre 19.5 %            Nutrition & Weight - Outcomes:  Pre Biometrics - 07/31/23 1604       Pre Biometrics   Height 5' 4.5" (1.638 m)    Weight 185 lb 8 oz (84.1 kg)    Waist Circumference 32.5 inches    Hip Circumference 41 inches    Waist to Hip Ratio 0.79 %    BMI (Calculated) 31.36     Grip Strength 12.7 kg    Single Leg Stand 2.6 seconds

## 2023-09-26 NOTE — Progress Notes (Signed)
Cardiac Individual Treatment Plan  Patient Details  Name: Kristy King MRN: [id number] Date of Birth: 1956/08/08 Referring Provider:   Flowsheet Row CARDIAC REHAB PHASE II ORIENTATION from 07/31/2023 in [facility] CARDIAC REHABILITATION  Referring Provider Delilah Shan       Initial Encounter Date:  Flowsheet Row CARDIAC REHAB PHASE II ORIENTATION from 07/31/2023 in [place] CARDIAC REHABILITATION  Date 07/31/23       Visit Diagnosis: S/P CABG x 3  Patient's Home Medications on Admission:  Current Outpatient Medications:    acetaminophen (TYLENOL) 500 MG tablet, Take 1,000 mg by mouth every 6 (six) hours as needed., Disp: , Rfl:    amiodarone (PACERONE) 200 MG tablet, Take 1 tablet by mouth daily., Disp: , Rfl:    aspirin EC 81 MG tablet, Take 1 tablet by mouth daily., Disp: , Rfl:    busPIRone (BUSPAR) 5 MG tablet, Take 5 mg by mouth 3 (three) times daily as needed., Disp: , Rfl:    citalopram (CELEXA) 20 MG tablet, Take 30 mg by mouth daily., Disp: , Rfl:    clopidogrel (PLAVIX) 75 MG tablet, Take 1 tablet by mouth daily., Disp: , Rfl:    Continuous Blood Gluc Sensor (FREESTYLE LIBRE 2 SENSOR) MISC, 1 Device by Does not apply route every 14 (fourteen) days., Disp: 6 each, Rfl: 3   ezetimibe (ZETIA) 10 MG tablet, Take 1 tablet by mouth at bedtime., Disp: , Rfl:    HUMALOG 100 UNIT/ML injection, Inject 0.6 mLs (60 Units total) into the skin as directed. Per insulin pump, Disp: 60 mL, Rfl: 3   levothyroxine (SYNTHROID) 175 MCG tablet, Take 175 mcg by mouth daily before breakfast., Disp: , Rfl:    losartan (COZAAR) 25 MG tablet, Take 1 tablet (25 mg total) by mouth daily., Disp: 30 tablet, Rfl: 3   melatonin 1 MG TABS tablet, Take 5 mg by mouth at bedtime., Disp: , Rfl:    metoprolol tartrate (LOPRESSOR) 25 MG tablet, Take 0.5 tablets by mouth 2 (two) times daily., Disp: , Rfl:    oxymetazoline (AFRIN) 0.05 % nasal spray, Place 1 spray into both nostrils 2 (two) times daily as needed  for congestion., Disp: , Rfl:    polyethylene glycol (MIRALAX / GLYCOLAX) 17 g packet, Take 17 g by mouth daily., Disp: , Rfl:    rosuvastatin (CRESTOR) 10 MG tablet, Take 10 mg by mouth daily., Disp: , Rfl:   Past Medical History: Past Medical History:  Diagnosis Date   Complication of anesthesia    Diabetes mellitus, type II (HCC)    Dyspnea    Family history of adverse reaction to anesthesia    History of kidney stones    Hypertension    Hypothyroidism    PONV (postoperative nausea and vomiting)     Tobacco Use: Social History   Tobacco Use  Smoking Status Former  Smokeless Tobacco Never    Labs: Review Flowsheet       Latest Ref Rng & Units 11/29/2021 02/09/2022  Labs for ITP Cardiac and Pulmonary Rehab  Hemoglobin A1c 4.0 - 5.6 % 7.5  8.1     Details            Capillary Blood Glucose: Lab Results  Component Value Date   GLUCAP 182 (H) 06/24/2021   GLUCAP 247 (H) 06/24/2021     Exercise Target Goals: Exercise Program Goal: Individual exercise prescription set using results from initial 6 min walk test and THRR while considering  patient's activity barriers and  safety.   Exercise Prescription Goal: Starting with aerobic activity 30 plus minutes a day, 3 days per week for initial exercise prescription. Provide home exercise prescription and guidelines that participant acknowledges understanding prior to discharge.  Activity Barriers & Risk Stratification:  Activity Barriers & Cardiac Risk Stratification - 07/31/23 1441       Activity Barriers & Cardiac Risk Stratification   Activity Barriers Arthritis;Fibromyalgia;Back Problems;History of Falls;Balance Concerns;Incisional Pain;Shortness of Breath    Cardiac Risk Stratification Moderate             6 Minute Walk:  6 Minute Walk     Row Name 07/31/23 1601         6 Minute Walk   Phase Initial     Distance 1300 feet     Walk Time 6 minutes     # of Rest Breaks 0     MPH 2.46     METS 2.97      RPE 14     Perceived Dyspnea  1     VO2 Peak 10.38     Symptoms Yes (comment)     Comments leg fatigue/soreness in thighs 4/10, SOB     Resting HR 87 bpm     Resting BP 124/64     Resting Oxygen Saturation  99 %     Exercise Oxygen Saturation  during 6 min walk 97 %     Max Ex. HR 113 bpm     Max Ex. BP 128/74     2 Minute Post BP 124/62              Oxygen Initial Assessment:   Oxygen Re-Evaluation:   Oxygen Discharge (Final Oxygen Re-Evaluation):   Initial Exercise Prescription:  Initial Exercise Prescription - 07/31/23 1600       Date of Initial Exercise RX and Referring Provider   Date 07/31/23    Referring Provider Delilah Shan      Oxygen   Maintain Oxygen Saturation 88% or higher      Treadmill   MPH 2.4    Grade 0.5    Minutes 15    METs 3      NuStep   Level 3    SPM 80    Minutes 15    METs 3      Prescription Details   Frequency (times per week) 3    Duration Progress to 30 minutes of continuous aerobic without signs/symptoms of physical distress      Intensity   THRR 40-80% of Max Heartrate 114-141    Ratings of Perceived Exertion 11-13    Perceived Dyspnea 0-4      Progression   Progression Continue to progress workloads to maintain intensity without signs/symptoms of physical distress.      Resistance Training   Training Prescription Yes    Weight 3 lb    Reps 10-15             Perform Capillary Blood Glucose checks as needed.  Exercise Prescription Changes:   Exercise Prescription Changes     Row Name 07/31/23 1600 09/04/23 1500 09/06/23 1500         Response to Exercise   Blood Pressure (Admit) 124/64 128/64 130/64 (P)      Blood Pressure (Exercise) 128/74 -- --     Blood Pressure (Exit) 124/62 126/70 102/60 (P)      Heart Rate (Admit) 87 bpm 71 bpm 85 bpm (P)      Heart Rate (  Exercise) 113 bpm 109 bpm 113 bpm (P)      Heart Rate (Exit) 89 bpm 75 bpm 98 bpm (P)      Oxygen Saturation (Admit) 99 % -- --      Oxygen Saturation (Exercise) 97 % -- --     Rating of Perceived Exertion (Exercise) 14 12 12  (P)      Perceived Dyspnea (Exercise) 1 -- --     Symptoms leg fatigue/soreness 4/10, SOB -- --     Comments walk test results -- --     Duration -- Continue with 30 min of aerobic exercise without signs/symptoms of physical distress. Continue with 30 min of aerobic exercise without signs/symptoms of physical distress. (P)      Intensity -- THRR unchanged THRR unchanged (P)        Progression   Progression -- Continue to progress workloads to maintain intensity without signs/symptoms of physical distress. Continue to progress workloads to maintain intensity without signs/symptoms of physical distress. (P)        Resistance Training   Training Prescription -- Yes Yes (P)      Weight -- 3 lbs 3 lbs / green (P)      Reps -- 10-15 10-15 (P)        Oxygen   Oxygen -- -- --       NuStep   Level -- 4 --     SPM -- 109 --     Minutes -- 15 --     METs -- 2.1 --       Track   Laps -- 49 --     Minutes -- 15 --     METs -- 2.4 --       Home Exercise Plan   Plans to continue exercise at -- Home (comment)  walking --     Frequency -- Add 2 additional days to program exercise sessions. --     Initial Home Exercises Provided -- 09/04/23 --       Oxygen   Maintain Oxygen Saturation -- 88% or higher --              Exercise Comments:   Exercise Goals and Review:   Exercise Goals     Row Name 07/31/23 1603             Exercise Goals   Increase Physical Activity Yes       Intervention Provide advice, education, support and counseling about physical activity/exercise needs.;Develop an individualized exercise prescription for aerobic and resistive training based on initial evaluation findings, risk stratification, comorbidities and participant's personal goals.       Expected Outcomes Short Term: Attend rehab on a regular basis to increase amount of physical activity.;Long Term: Add  in home exercise to make exercise part of routine and to increase amount of physical activity.;Long Term: Exercising regularly at least 3-5 days a week.       Increase Strength and Stamina Yes       Intervention Provide advice, education, support and counseling about physical activity/exercise needs.;Develop an individualized exercise prescription for aerobic and resistive training based on initial evaluation findings, risk stratification, comorbidities and participant's personal goals.       Expected Outcomes Short Term: Increase workloads from initial exercise prescription for resistance, speed, and METs.;Short Term: Perform resistance training exercises routinely during rehab and add in resistance training at home;Long Term: Improve cardiorespiratory fitness, muscular endurance and strength as measured by increased METs and functional capacity (  )       Able to understand and use rate of perceived exertion (RPE) scale Yes       Intervention Provide education and explanation on how to use RPE scale       Expected Outcomes Short Term: Able to use RPE daily in rehab to express subjective intensity level;Long Term:  Able to use RPE to guide intensity level when exercising independently       Able to understand and use Dyspnea scale Yes       Intervention Provide education and explanation on how to use Dyspnea scale       Expected Outcomes Short Term: Able to use Dyspnea scale daily in rehab to express subjective sense of shortness of breath during exertion;Long Term: Able to use Dyspnea scale to guide intensity level when exercising independently       Knowledge and understanding of Target Heart Rate Range (THRR) Yes       Intervention Provide education and explanation of THRR including how the numbers were predicted and where they are located for reference       Expected Outcomes Short Term: Able to state/look up THRR;Long Term: Able to use THRR to govern intensity when exercising independently;Short  Term: Able to use daily as guideline for intensity in rehab       Able to check pulse independently Yes       Intervention Provide education and demonstration on how to check pulse in carotid and radial arteries.;Review the importance of being able to check your own pulse for safety during independent exercise       Expected Outcomes Short Term: Able to explain why pulse checking is important during independent exercise;Long Term: Able to check pulse independently and accurately       Understanding of Exercise Prescription Yes       Intervention Provide education, explanation, and written materials on patient's individual exercise prescription       Expected Outcomes Short Term: Able to explain program exercise prescription;Long Term: Able to explain home exercise prescription to exercise independently                Exercise Goals Re-Evaluation :  Exercise Goals Re-Evaluation     Row Name 09/04/23 1510             Exercise Goal Re-Evaluation   Exercise Goals Review Increase Physical Activity;Increase Strength and Stamina;Able to understand and use rate of perceived exertion (RPE) scale;Able to understand and use Dyspnea scale;Knowledge and understanding of Target Heart Rate Range (THRR);Able to check pulse independently;Understanding of Exercise Prescription       Comments Reviewed home exercise with pt today.  Pt plans to walk and use weights at home for exercise.  Reviewed THR, pulse, RPE, sign and symptoms, pulse oximetery and when to call 911 or MD.  Also discussed weather considerations and indoor options.  Pt voiced understanding.       Expected Outcomes Short: Start to add in more walking at home Long: Continue to improve stamin a                 Discharge Exercise Prescription (Final Exercise Prescription Changes):  Exercise Prescription Changes - 09/06/23 1500       Response to Exercise   Blood Pressure (Admit) 130/64 (P)     Blood Pressure (Exit) 102/60 (P)      Heart Rate (Admit) 85 bpm (P)     Heart Rate (Exercise) 113 bpm (P)  Heart Rate (Exit) 98 bpm (P)     Rating of Perceived Exertion (Exercise) 12 (P)     Duration Continue with 30 min of aerobic exercise without signs/symptoms of physical distress. (P)     Intensity THRR unchanged (P)       Progression   Progression Continue to progress workloads to maintain intensity without signs/symptoms of physical distress. (P)       Resistance Training   Training Prescription Yes (P)     Weight 3 lbs / green (P)     Reps 10-15 (P)              Nutrition:  Target Goals: Understanding of nutrition guidelines, daily intake of sodium 1500mg , cholesterol 200mg , calories 30% from fat and 7% or less from saturated fats, daily to have 5 or more servings of fruits and vegetables.  Biometrics:  Pre Biometrics - 07/31/23 1604       Pre Biometrics   Height 5' 4.5" (1.638 m)    Weight 185 lb 8 oz (84.1 kg)    Waist Circumference 32.5 inches    Hip Circumference 41 inches    Waist to Hip Ratio 0.79 %    BMI (Calculated) 31.36    Grip Strength 12.7 kg    Single Leg Stand 2.6 seconds              Nutrition Therapy Plan and Nutrition Goals:   Nutrition Assessments:  MEDIFICTS Score Key: >=70 Need to make dietary changes  40-70 Heart Healthy Diet <= 40 Therapeutic Level Cholesterol Diet  Flowsheet Row CARDIAC REHAB PHASE II ORIENTATION from 07/31/2023 in [facility] CARDIAC REHABILITATION  Picture Your Plate Total Score on Admission 54      Picture Your Plate Scores: <16 Unhealthy dietary pattern with much room for improvement. 41-50 Dietary pattern unlikely to meet recommendations for good health and room for improvement. 51-60 More healthful dietary pattern, with some room for improvement.  >60 Healthy dietary pattern, although there may be some specific behaviors that could be improved.    Nutrition Goals Re-Evaluation:  Nutrition Goals Re-Evaluation     Row Name  09/06/23 1519             Goals   Nutrition Goal Heart Healthy Diet       Comment Kristy King is doing well in rehab.  She has been working on her diet some.  However, she enjoyed her taco soup over the weekend but noted that it was full of salt.  She is working on getting in more fruits and vegetables.  She is trying to add in variety.  She is doing well with getting lean meats.  She actually put chicken in her taco soup.  She also got an air fryer to start using at home.       Expected Outcome Short: Learn to use air fryer Long: Continue to work on heart healthy alternatives                Nutrition Goals Discharge (Final Nutrition Goals Re-Evaluation):  Nutrition Goals Re-Evaluation - 09/06/23 1519       Goals   Nutrition Goal Heart Healthy Diet    Comment Kristy King is doing well in rehab.  She has been working on her diet some.  However, she enjoyed her taco soup over the weekend but noted that it was full of salt.  She is working on getting in more fruits and vegetables.  She is trying to add in variety.  She is doing well with getting lean meats.  She actually put chicken in her taco soup.  She also got an air fryer to start using at home.    Expected Outcome Short: Learn to use air fryer Long: Continue to work on heart healthy alternatives             Psychosocial: Target Goals: Acknowledge presence or absence of significant depression and/or stress, maximize coping skills, provide positive support system. Participant is able to verbalize types and ability to use techniques and skills needed for reducing stress and depression.  Initial Review & Psychosocial Screening:  Initial Psych Review & Screening - 07/31/23 1540       Initial Review   Current issues with History of Depression;Current Psychotropic Meds      Family Dynamics   Good Support System? Yes      Barriers   Psychosocial barriers to participate in program Psychosocial barriers identified (see note)      Screening  Interventions   Interventions Encouraged to exercise;To provide support and resources with identified psychosocial needs;Provide feedback about the scores to participant    Expected Outcomes Short Term goal: Utilizing psychosocial counselor, staff and physician to assist with identification of specific Stressors or current issues interfering with healing process. Setting desired goal for each stressor or current issue identified.;Long Term Goal: Stressors or current issues are controlled or eliminated.;Short Term goal: Identification and review with participant of any Quality of Life or Depression concerns found by scoring the questionnaire.;Long Term goal: The participant improves quality of Life and PHQ9 Scores as seen by post scores and/or verbalization of changes             Quality of Life Scores:  Quality of Life - 07/31/23 1604       Quality of Life   Select Quality of Life      Quality of Life Scores   Health/Function Pre 15.25 %    Socioeconomic Pre 27 %    Psych/Spiritual Pre 22.8 %    Family Pre 24 %    GLOBAL Pre 19.5 %            Scores of 19 and below usually indicate a poorer quality of life in these areas.  A difference of  2-3 points is a clinically meaningful difference.  A difference of 2-3 points in the total score of the Quality of Life Index has been associated with significant improvement in overall quality of life, self-image, physical symptoms, and general health in studies assessing change in quality of life.  PHQ-9: Review Flowsheet       07/31/2023 12/09/2015  Depression screen PHQ 2/9  Decreased Interest 1 0  Down, Depressed, Hopeless 0 0  PHQ - 2 Score 1 0  Altered sleeping 0 -  Tired, decreased energy 1 -  Change in appetite 1 -  Feeling bad or failure about yourself  0 -  Trouble concentrating 0 -  Moving slowly or fidgety/restless 0 -  Suicidal thoughts 0 -  PHQ-9 Score 3 -  Difficult doing work/chores Not difficult at all -    Details            Interpretation of Total Score  Total Score Depression Severity:  1-4 = Minimal depression, 5-9 = Mild depression, 10-14 = Moderate depression, 15-19 = Moderately severe depression, 20-27 = Severe depression   Psychosocial Evaluation and Intervention:  Psychosocial Evaluation - 07/31/23 1541       Psychosocial Evaluation & Interventions  Interventions Stress management education;Relaxation education;Encouraged to exercise with the program and follow exercise prescription    Comments Patient was referred to CR with CABGx3. She had her surgery in July but did some home PT before starting CR. Her initial PHQ-9 score was 3 due to fatigue and over eating. She says she has a long history of depression and anxiety and is currently taking Citalopram and Buspar. She says her depression and anxiety has greatly improved since her surgery. She feels this is due to her having time to reflect on her life and get closer to [place]. She says she does not have any major stressors in her life. 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 She lives with her husband of 30 years. She says he is very supportative of her and she also has 2 friends that support her. She does have a son that lives in [place] but she talks to him often. Her main goals for the program are to get back to normal; improve her energy and stamina, and get motivated to do things.    Expected Outcomes Short Term: start the program and attend consistently. Long Term: Meet her personal goals.    Continue Psychosocial Services  Follow up required by staff             Psychosocial Re-Evaluation:  Psychosocial Re-Evaluation     Row Name 09/06/23 1515             Psychosocial Re-Evaluation   Current issues with History of Depression;Current Anxiety/Panic;Current Psychotropic Meds       Comments Kristy King is doing well in rehab. She is feeling good mentally and denies any major stressors.  She says she feels like a  whole new person now both mentally and physically. She no longer struggling with anxiety and depression symptoms.  She is sleeping well.  Her energy is picking up.       Expected Outcomes Short: Continue to exercise for mental boost Long; conitnue to focus on the positives                Psychosocial Discharge (Final Psychosocial Re-Evaluation):  Psychosocial Re-Evaluation - 09/06/23 1515       Psychosocial Re-Evaluation   Current issues with History of Depression;Current Anxiety/Panic;Current Psychotropic Meds    Comments Kristy King is doing well in rehab. She is feeling good mentally and denies any major stressors.  She says she feels like a whole new person now both mentally and physically. She no longer struggling with anxiety and depression symptoms.  She is sleeping well.  Her energy is picking up.    Expected Outcomes Short: Continue to exercise for mental boost Long; conitnue to focus on the positives             Vocational Rehabilitation: Provide vocational rehab assistance to qualifying candidates.   Vocational Rehab Evaluation & Intervention:  Vocational Rehab - 07/31/23 1540       Initial Vocational Rehab Evaluation & Intervention   Assessment shows need for Vocational Rehabilitation No      Vocational Rehab Re-Evaulation   Comments Patient is retired.             Education: Education Goals: Education classes will be provided on a weekly basis, covering required topics. Participant will state understanding/return demonstration of topics presented.  Learning Barriers/Preferences:  Learning Barriers/Preferences - 07/31/23 1540       Learning Barriers/Preferences   Learning Barriers None    Learning Preferences Audio;Skilled  Demonstration             Education Topics: Hypertension, Hypertension Reduction -Define heart disease and high blood pressure. Discus how high blood pressure affects the body and ways to reduce high blood pressure.   Exercise  and Your Heart -Discuss why it is important to exercise, the FITT principles of exercise, normal and abnormal responses to exercise, and how to exercise safely.   Angina -Discuss definition of angina, causes of angina, treatment of angina, and how to decrease risk of having angina.   Cardiac Medications -Review what the following cardiac medications are used for, how they affect the body, and side effects that may occur when taking the medications.  Medications include Aspirin, Beta blockers, calcium channel blockers, ACE Inhibitors, angiotensin receptor blockers, diuretics, digoxin, and antihyperlipidemics. Flowsheet Row CARDIAC REHAB PHASE II EXERCISE from 09/06/2023 in [place] CARDIAC REHABILITATION  Date 08/30/23  Educator jh  Instruction Review Code 1- Verbalizes Understanding       Congestive Heart Failure -Discuss the definition of CHF, how to live with CHF, the signs and symptoms of CHF, and how keep track of weight and sodium intake. Flowsheet Row CARDIAC REHAB PHASE II EXERCISE from 09/06/2023 in [place] CARDIAC REHABILITATION  Date 08/23/23  Educator [facility]  Instruction Review Code 1- Verbalizes Understanding       Heart Disease and Intimacy -Discus the effect sexual activity has on the heart, how changes occur during intimacy as we age, and safety during sexual activity.   Smoking Cessation / COPD -Discuss different methods to quit smoking, the health benefits of quitting smoking, and the definition of COPD.   Nutrition I: Fats -Discuss the types of cholesterol, what cholesterol does to the heart, and how cholesterol levels can be controlled.   Nutrition II: Labels -Discuss the different components of food labels and how to read food label   Heart Parts/Heart Disease and PAD -Discuss the anatomy of the heart, the pathway of blood circulation through the heart, and these are affected by heart disease.   Stress I: Signs and Symptoms -Discuss the causes of  stress, how stress may lead to anxiety and depression, and ways to limit stress. Flowsheet Row CARDIAC REHAB PHASE II EXERCISE from 09/06/2023 in [place] CARDIAC REHABILITATION  Date 08/16/23  Educator HB  Instruction Review Code 1- Verbalizes Understanding       Stress II: Relaxation -Discuss different types of relaxation techniques to limit stress.   Warning Signs of Stroke / TIA -Discuss definition of a stroke, what the signs and symptoms are of a stroke, and how to identify when someone is having stroke.   Knowledge Questionnaire Score:   Core Components/Risk Factors/Patient Goals at Admission:  Personal Goals and Risk Factors at Admission - 07/31/23 1539       Core Components/Risk Factors/Patient Goals on Admission    Weight Management Obesity    Improve shortness of breath with ADL's Yes    Intervention Provide education, individualized exercise plan and daily activity instruction to help decrease symptoms of SOB with activities of daily living.    Expected Outcomes Short Term: Improve cardiorespiratory fitness to achieve a reduction of symptoms when performing ADLs;Long Term: Be able to perform more ADLs without symptoms or delay the onset of symptoms    Diabetes Yes    Intervention Provide education about signs/symptoms and action to take for hypo/hyperglycemia.;Provide education about proper nutrition, including hydration, and aerobic/resistive exercise prescription along with prescribed medications to achieve blood glucose in normal  ranges: Fasting glucose 65-99 mg/dL    Expected Outcomes Short Term: Participant verbalizes understanding of the signs/symptoms and immediate care of hyper/hypoglycemia, proper foot care and importance of medication, aerobic/resistive exercise and nutrition plan for blood glucose control.;Long Term: Attainment of HbA1C < 7%.    Hypertension Yes    Intervention Provide education on lifestyle modifcations including regular physical  activity/exercise, weight management, moderate sodium restriction and increased consumption of fresh fruit, vegetables, and low fat dairy, alcohol moderation, and smoking cessation.;Monitor prescription use compliance.    Expected Outcomes Short Term: Continued assessment and intervention until BP is < 140/52mm HG in hypertensive participants. < 130/89mm HG in hypertensive participants with diabetes, heart failure or chronic kidney disease.;Long Term: Maintenance of blood pressure at goal levels.    Lipids Yes    Intervention Provide education and support for participant on nutrition & aerobic/resistive exercise along with prescribed medications to achieve LDL 70mg , HDL >40mg .    Expected Outcomes Short Term: Participant states understanding of desired cholesterol values and is compliant with medications prescribed. Participant is following exercise prescription and nutrition guidelines.;Long Term: Cholesterol controlled with medications as prescribed, with individualized exercise RX and with personalized nutrition plan. Value goals: LDL < 70mg , HDL > 40 mg.             Core Components/Risk Factors/Patient Goals Review:   Goals and Risk Factor Review     Row Name 09/06/23 1522             Core Components/Risk Factors/Patient Goals Review   Personal Goals Review Weight Management/Obesity;Heart Failure;Hypertension;Diabetes       Review Nailani is doing well in rehab.  Her weight was up some this week from the salt in her soup.  We talked about getting used to being without salt.  She is staying away from sugar to help control her diabetes. Her blood sugars have been controlled.  She has not had any heart failure symptoms.  Her blood pressures have been good.       Expected Outcomes Short: Conitnue to work on weight loss lOng; Continue to monitor risk factors.                Core Components/Risk Factors/Patient Goals at Discharge (Final Review):   Goals and Risk Factor Review - 09/06/23  1522       Core Components/Risk Factors/Patient Goals Review   Personal Goals Review Weight Management/Obesity;Heart Failure;Hypertension;Diabetes    Review Kristy King is doing well in rehab.  Her weight was up some this week from the salt in her soup.  We talked about getting used to being without salt.  She is staying away from sugar to help control her diabetes. Her blood sugars have been controlled.  She has not had any heart failure symptoms.  Her blood pressures have been good.    Expected Outcomes Short: Conitnue to work on weight loss lOng; Continue to monitor risk factors.             ITP Comments:  ITP Comments     Row Name 08/02/23 1136 08/02/23 1201 08/07/23 1114 08/30/23 0756 09/26/23 0958   ITP Comments Patient arrived for 1st visit/orientation/education at 1400. Patient was referred to CR by Dr. Delilah Shan due CABGx3. During orientation advised patient on arrival and appointment times what to wear, what to do before, during and after exercise. Reviewed attendance and class policy.  Pt is scheduled to return Cardiac Rehab on 08/04/23 at 1430. Pt was advised to come to class  15 minutes before class starts.  Discussed RPE/Dpysnea scales. Patient participated in warm up stretches. Patient was able to complete 6 minute walk test.  Telemetry:NSR. Patient was measured for the equipment. Discussed equipment safety with patient. Took patient pre-anthropometric measurements. Patient finished visit at 1530. 30 day review completed. ITP sent to Dr. Dina Rich, Medical Director of Cardiac Rehab. Continue with ITP unless changes are made by physician.   Pt has only completed orientation thus far. Pt called to let us know that she would not be in today.  She had an episode of chest pain last night that scared her and she took NTG to help.  She is feeling better today, but feels very tired.  She was encouraged to call her cardiologist office to let them know what happened. 30 day review completed.  ITP sent to Dr. Dina Rich, Medical Director of Cardiac Rehab. Continue with ITP unless changes are made by physician.   Newer to program.  Has had intermitten attendance unable to assess for goals this round. Pt returned our call. She and her husband decided to discharge from rehab and plan to go to gym together.  We will discharge her at this time.            Comments: Discharge ITP

## 2023-09-27 ENCOUNTER — Encounter (HOSPITAL_COMMUNITY): Payer: Medicare HMO

## 2023-09-29 ENCOUNTER — Encounter (HOSPITAL_COMMUNITY): Payer: Medicare HMO

## 2023-10-02 ENCOUNTER — Encounter (HOSPITAL_COMMUNITY): Payer: Medicare HMO

## 2023-10-04 ENCOUNTER — Encounter (HOSPITAL_COMMUNITY): Payer: Medicare HMO

## 2023-10-06 ENCOUNTER — Encounter (HOSPITAL_COMMUNITY): Payer: Medicare HMO

## 2023-10-09 ENCOUNTER — Encounter (HOSPITAL_COMMUNITY): Payer: Medicare HMO

## 2023-10-11 ENCOUNTER — Encounter (HOSPITAL_COMMUNITY): Payer: Medicare HMO

## 2023-10-13 ENCOUNTER — Encounter (HOSPITAL_COMMUNITY): Payer: Medicare HMO

## 2023-10-16 ENCOUNTER — Encounter (HOSPITAL_COMMUNITY): Payer: Medicare HMO

## 2023-10-20 ENCOUNTER — Encounter (HOSPITAL_COMMUNITY): Payer: Medicare HMO

## 2023-10-23 ENCOUNTER — Encounter (HOSPITAL_COMMUNITY): Payer: Medicare HMO

## 2023-11-23 ENCOUNTER — Other Ambulatory Visit: Payer: Self-pay | Admitting: Nurse Practitioner

## 2023-11-23 ENCOUNTER — Telehealth: Payer: Self-pay | Admitting: Nurse Practitioner

## 2023-11-23 MED ORDER — HUMALOG 100 UNIT/ML IJ SOLN: 60.0000 [IU] | INTRAMUSCULAR | 1 refills | Status: DC

## 2023-11-23 NOTE — Telephone Encounter (Signed)
Rx sent

## 2023-11-23 NOTE — Telephone Encounter (Signed)
Pt is needing her Humolog prescription sent to [facility] in [place].  Says her old pharmacy can no longer refill it due to her having a pump.

## 2023-11-23 NOTE — Telephone Encounter (Signed)
Dr. Fransico Him may be able to clarify this, it is his patient.

## 2023-12-06 ENCOUNTER — Telehealth: Payer: Self-pay | Admitting: "Endocrinology

## 2023-12-06 NOTE — Telephone Encounter (Signed)
Patient needs her Sensors sent into [facility].  Fax number [phone]

## 2023-12-08 ENCOUNTER — Other Ambulatory Visit: Payer: Self-pay

## 2023-12-08 DIAGNOSIS — E10319 Type 1 diabetes mellitus with unspecified diabetic retinopathy without macular edema: Secondary | ICD-10-CM

## 2023-12-08 MED ORDER — FREESTYLE LIBRE 2 SENSOR MISC: 0 refills | Status: DC

## 2023-12-08 NOTE — Telephone Encounter (Signed)
Rx refill for [place] 2 sensors faxed to [facility].

## 2023-12-28 ENCOUNTER — Ambulatory Visit: Payer: Medicare HMO | Admitting: "Endocrinology

## 2024-02-07 ENCOUNTER — Other Ambulatory Visit: Payer: Self-pay | Admitting: *Deleted

## 2024-02-07 ENCOUNTER — Telehealth: Payer: Self-pay | Admitting: "Endocrinology

## 2024-02-07 DIAGNOSIS — E10319 Type 1 diabetes mellitus with unspecified diabetic retinopathy without macular edema: Secondary | ICD-10-CM

## 2024-02-07 DIAGNOSIS — Z794 Long term (current) use of insulin: Secondary | ICD-10-CM

## 2024-02-07 DIAGNOSIS — E782 Mixed hyperlipidemia: Secondary | ICD-10-CM

## 2024-02-07 DIAGNOSIS — E039 Hypothyroidism, unspecified: Secondary | ICD-10-CM

## 2024-02-07 NOTE — Telephone Encounter (Signed)
 Labs need to be updated.

## 2024-02-07 NOTE — Telephone Encounter (Signed)
Labs have been updated . 

## 2024-02-28 LAB — COMPREHENSIVE METABOLIC PANEL WITH GFR
ALT: 14 IU/L (ref 0–32)
AST: 16 IU/L (ref 0–40)
Albumin: 4.1 g/dL (ref 3.9–4.9)
Alkaline Phosphatase: 142 IU/L — ABNORMAL HIGH (ref 44–121)
BUN/Creatinine Ratio: 17 (ref 12–28)
BUN: 14 mg/dL (ref 8–27)
Bilirubin Total: 0.5 mg/dL (ref 0.0–1.2)
CO2: 25 mmol/L (ref 20–29)
Calcium: 9.1 mg/dL (ref 8.7–10.3)
Chloride: 100 mmol/L (ref 96–106)
Creatinine, Ser: 0.84 mg/dL (ref 0.57–1.00)
Globulin, Total: 2.5 g/dL (ref 1.5–4.5)
Glucose: 229 mg/dL — ABNORMAL HIGH (ref 70–99)
Potassium: 4 mmol/L (ref 3.5–5.2)
Sodium: 137 mmol/L (ref 134–144)
Total Protein: 6.6 g/dL (ref 6.0–8.5)
eGFR: 77 mL/min/{1.73_m2} (ref >59)

## 2024-02-28 LAB — LIPID PANEL
Chol/HDL Ratio: 2.3 ratio (ref 0.0–4.4)
Cholesterol, Total: 136 mg/dL (ref 100–199)
HDL: 58 mg/dL (ref >39)
LDL Chol Calc (NIH): 66 mg/dL (ref 0–99)
Triglycerides: 55 mg/dL (ref 0–149)
VLDL Cholesterol Cal: 12 mg/dL (ref 5–40)

## 2024-02-28 LAB — VITAMIN D 25 HYDROXY (VIT D DEFICIENCY, FRACTURES): Vit D, 25-Hydroxy: 26.9 ng/mL — ABNORMAL LOW (ref 30.0–100.0)

## 2024-02-28 LAB — T4, FREE: Free T4: 1.52 ng/dL (ref 0.82–1.77)

## 2024-02-28 LAB — VITAMIN B12: Vitamin B-12: 114 pg/mL — ABNORMAL LOW (ref 232–1245)

## 2024-02-28 LAB — TSH: TSH: 1.82 u[IU]/mL (ref 0.450–4.500)

## 2024-03-01 ENCOUNTER — Encounter: Payer: Self-pay | Admitting: "Endocrinology

## 2024-03-01 ENCOUNTER — Ambulatory Visit (INDEPENDENT_AMBULATORY_CARE_PROVIDER_SITE_OTHER): Admitting: "Endocrinology

## 2024-03-01 ENCOUNTER — Telehealth: Payer: Self-pay | Admitting: "Endocrinology

## 2024-03-01 VITALS — BP 136/64 | HR 72 | Ht 65.0 in | Wt 176.6 lb

## 2024-03-01 DIAGNOSIS — Z794 Long term (current) use of insulin: Secondary | ICD-10-CM | POA: Diagnosis not present

## 2024-03-01 DIAGNOSIS — E039 Hypothyroidism, unspecified: Secondary | ICD-10-CM

## 2024-03-01 DIAGNOSIS — E10319 Type 1 diabetes mellitus with unspecified diabetic retinopathy without macular edema: Secondary | ICD-10-CM | POA: Diagnosis not present

## 2024-03-01 DIAGNOSIS — E559 Vitamin D deficiency, unspecified: Secondary | ICD-10-CM | POA: Insufficient documentation

## 2024-03-01 DIAGNOSIS — E782 Mixed hyperlipidemia: Secondary | ICD-10-CM | POA: Diagnosis not present

## 2024-03-01 DIAGNOSIS — E538 Deficiency of other specified B group vitamins: Secondary | ICD-10-CM | POA: Insufficient documentation

## 2024-03-01 LAB — POCT GLYCOSYLATED HEMOGLOBIN (HGB A1C): HbA1c, POC (controlled diabetic range): 7.7 % — AB (ref 0.0–7.0)

## 2024-03-01 MED ORDER — CYANOCOBALAMIN 250 MCG PO TABS: 250.0000 ug | ORAL_TABLET | Freq: Every day | ORAL | 0 refills | Status: AC

## 2024-03-01 MED ORDER — VITAMIN D3 50 MCG (2000 UT) PO CAPS: 2000.0000 [IU] | ORAL_CAPSULE | Freq: Every day | ORAL | 1 refills | Status: AC

## 2024-03-01 NOTE — Telephone Encounter (Signed)
 Pt is going to call around [place] area and see where we can send in a Retina Specialist. She will call us  back.

## 2024-03-01 NOTE — Progress Notes (Signed)
 03/01/2024, 1:12 PM  Endocrinology follow-up note   Subjective:    Patient ID: Kristy King, female    DOB: [birth year].  Kristy King is being seen in follow-up after she was seen in consultation for management of currently uncontrolled symptomatic diabetes requested by  Jacqualin Mate, DO.   Past Medical History:  Diagnosis Date   Complication of anesthesia    Diabetes mellitus, type II (HCC)    Dyspnea    Family history of adverse reaction to anesthesia    History of kidney stones    Hypertension    Hypothyroidism    PONV (postoperative nausea and vomiting)     Past Surgical History:  Procedure Laterality Date   APPENDECTOMY N/A 2015   BACK SURGERY     BREAST ENHANCEMENT SURGERY  1990   BREAST SURGERY     CARDIAC SURGERY  2024   CESAREAN SECTION  1982   CHOLECYSTECTOMY N/A 2017   EXTRACORPOREAL SHOCK WAVE LITHOTRIPSY Left 06/24/2021   Procedure: EXTRACORPOREAL SHOCK WAVE LITHOTRIPSY (ESWL);  Surgeon: Trent Frizzle, MD;  Location: [facility];  Service: Urology;  Laterality: Left;   EYE SURGERY      Social History   Socioeconomic History   Marital status: Unknown    Spouse name: Not on file   Number of children: Not on file   Years of education: Not on file   Highest education level: Not on file  Occupational History   Not on file  Tobacco Use   Smoking status: Former   Smokeless tobacco: Never  Substance and Sexual Activity   Alcohol use: No    Alcohol/week: 0.0 standard drinks of alcohol   Drug use: No   Sexual activity: Not on file  Other Topics Concern   Not on file  Social History Narrative   Not on file   Social Drivers of Health   Financial Resource Strain: Not on file  Food Insecurity: Low Risk  (06/27/2023)   Received from [facility]   Hunger Vital Sign    Worried About Running Out of Food in the Last Year: Never true    Ran Out of Food in the Last Year: Never true  Transportation Needs: No  Transportation Needs (06/27/2023)   Received from [facility]    In the past 12 months, has lack of reliable transportation kept you from medical appointments, meetings, work or from getting things needed for daily living? : No  Physical Activity: Not on file  Stress: Not on file  Social Connections: Not on file    Family History  Problem Relation Age of Onset   Valvular heart disease Mother    Cancer Sister    Heart attack Paternal Grandfather    Diabetes Neg Hx     Outpatient Encounter Medications as of 03/01/2024  Medication Sig   Cholecalciferol (VITAMIN D3) 50 MCG (2000 UT) capsule Take 1 capsule (2,000 Units total) by mouth daily with lunch.   cyanocobalamin (VITAMIN B12) 250 MCG tablet Take 1 tablet (250 mcg total) by mouth daily with lunch.   acetaminophen (TYLENOL) 500 MG tablet Take 1,000 mg by mouth every 6 (six) hours as needed.   amiodarone (PACERONE) 200 MG tablet Take 1 tablet by mouth daily.   aspirin EC 81 MG tablet Take 1 tablet by mouth daily.   busPIRone (BUSPAR) 5 MG tablet Take 5 mg by mouth 3 (three) times daily as needed.   citalopram (CELEXA) 20 MG  tablet Take 30 mg by mouth daily.   clopidogrel (PLAVIX) 75 MG tablet Take 1 tablet by mouth daily.   Continuous Glucose Sensor (FREESTYLE LIBRE 2 SENSOR) MISC USE TO MONITOR GLUCOSE CONTINUOUSLY AS DIRECTED   ezetimibe (ZETIA) 10 MG tablet Take 1 tablet by mouth at bedtime.   HUMALOG  100 UNIT/ML injection USE WITH INSULIN  PUMP FOR A TOTAL DAILY DOSE OF 60 UNITS PER DAY   levothyroxine  (SYNTHROID ) 175 MCG tablet Take 175 mcg by mouth daily before breakfast.   losartan  (COZAAR ) 25 MG tablet Take 1 tablet (25 mg total) by mouth daily.   melatonin 1 MG TABS tablet Take 5 mg by mouth at bedtime.   metoprolol  tartrate (LOPRESSOR ) 25 MG tablet Take 0.5 tablets by mouth 2 (two) times daily.   oxymetazoline (AFRIN) 0.05 % nasal spray Place 1 spray into both nostrils 2 (two) times daily as needed for  congestion.   polyethylene glycol (MIRALAX / GLYCOLAX) 17 g packet Take 17 g by mouth daily.   rosuvastatin (CRESTOR) 10 MG tablet Take 10 mg by mouth daily.   No facility-administered encounter medications on file as of 03/01/2024.    ALLERGIES: Allergies  Allergen Reactions   Epinephrine     tachycardia   Sulfa Antibiotics     VACCINATION STATUS: Immunization History  Administered Date(s) Administered   Moderna Sars-Covid-2 Vaccination 12/29/2019, 01/26/2020, 08/19/2020, 03/09/2021    Diabetes She presents for her follow-up diabetic visit. She has type 1 diabetes mellitus. Onset time: She was diagnosed at approximate age of 21 years. Her disease course has been worsening. Pertinent negatives for hypoglycemia include no confusion, headaches, hunger, pallor, seizures, sweats or tremors. Associated symptoms include visual change. Pertinent negatives for diabetes include no chest pain, no fatigue, no polydipsia, no polyphagia and no polyuria. There are no hypoglycemic complications. Symptoms are worsening. Diabetic complications include heart disease and retinopathy. Risk factors for coronary artery disease include diabetes mellitus, dyslipidemia, obesity and tobacco exposure. Current diabetic treatment includes insulin  pump. Her weight is decreasing steadily. She is following a generally unhealthy diet. When asked about meal planning, she reported none. Her home blood glucose trend is fluctuating minimally. Her breakfast blood glucose range is generally 140-180 mg/dl. Her lunch blood glucose range is generally 140-180 mg/dl. Her dinner blood glucose range is generally 140-180 mg/dl. Her bedtime blood glucose range is generally 140-180 mg/dl. Her overall blood glucose range is 140-180 mg/dl. (She wears Medtronic MiniMed 630G insulin  pump.  She was given a revised insulin  pump settings during her last visit.  She made some significant changes to her settings including 7 different basal flow rates  which led to uncontrolled nocturnal hyperglycemia and presents with A1c of 7.7% at point-of-care today.    Her AGP report shows average blood glucose of 170 mg per DL over the last 14 days, 59% time in range, 27% level 1 hyperglycemia, 12% level 2 hyperglycemia.  She does have 2% level 1 hypoglycemia which seems to be significant improvement from her prior presentation of 11% hypoglycemia.    Her recent daily insulin  flow rate suggests 66% basal, 34% bolus flow rates.  Her basal flow rates are 12 AM 0.2 units, 2 AM 0.2 to 5 units, 4 AM 0.425 units, 5 AM 0.5 units, 7 AM 1.95 units, 11 AM 2.1 units, 8 PM 0.5 units. She consumes approximately 72 carbs daily.  Insulin  active time is 3 hours.)  Hyperlipidemia This is a chronic problem. The current episode started more than 1 year ago. Exacerbating  diseases include diabetes, hypothyroidism and obesity. Pertinent negatives include no chest pain, myalgias or shortness of breath. Current antihyperlipidemic treatment includes statins. Risk factors for coronary artery disease include diabetes mellitus, dyslipidemia, hypertension, a sedentary lifestyle, post-menopausal and obesity.  Hypertension This is a chronic problem. Pertinent negatives include no chest pain, headaches, palpitations, shortness of breath or sweats. Risk factors for coronary artery disease include dyslipidemia, obesity, diabetes mellitus, post-menopausal state, sedentary lifestyle and smoking/tobacco exposure. Past treatments include angiotensin blockers. Hypertensive end-organ damage includes CAD/MI and retinopathy.       Objective:       03/01/2024   10:03 AM 08/17/2023    2:39 PM 07/31/2023    4:04 PM  Vitals with BMI  Height 5\' 5"  5\' 5"  5' 4.5"  Weight 176 lbs 10 oz 182 lbs 13 oz 185 lbs 8 oz  BMI 29.39 30.42 31.36  Systolic 136 104   Diastolic 64 56   Pulse 72 88     BP 136/64   Pulse 72   Ht 5\' 5"  (1.651 m)   Wt 176 lb 9.6 oz (80.1 kg)   BMI 29.39 kg/m   Wt Readings  from Last 3 Encounters:  03/01/24 176 lb 9.6 oz (80.1 kg)  08/17/23 182 lb 12.8 oz (82.9 kg)  07/31/23 185 lb 8 oz (84.1 kg)     CMP ( most recent) CMP     Component Value Date/Time   NA 137 02/27/2024 1356   K 4.0 02/27/2024 1356   CL 100 02/27/2024 1356   CO2 25 02/27/2024 1356   GLUCOSE 229 (H) 02/27/2024 1356   BUN 14 02/27/2024 1356   CREATININE 0.84 02/27/2024 1356   CALCIUM 9.1 02/27/2024 1356   PROT 6.6 02/27/2024 1356   ALBUMIN 4.1 02/27/2024 1356   AST 16 02/27/2024 1356   ALT 14 02/27/2024 1356   ALKPHOS 142 (H) 02/27/2024 1356   BILITOT 0.5 02/27/2024 1356   EGFR 77 02/27/2024 1356     Diabetic Labs (most recent): Lab Results  Component Value Date   HGBA1C 7.7 (A) 03/01/2024   HGBA1C 8.1 (A) 02/09/2022   HGBA1C 7.5 (A) 11/29/2021        1 mo ago   Sodium 136 - 145 mmol/L 131 Low   Potassium 3.5 - 5.1 mmol/L 3.8  Comment: NO VISIBLE HEMOLYSIS  Chloride 98 - 107 mmol/L 95 Low   CO2 21 - 31 mmol/L 31  Anion Gap 6 - 14 mmol/L 5 Low   Glucose, Random 70 - 99 mg/dL 161 High   Blood Urea Nitrogen (BUN) 7 - 25 mg/dL 12  Creatinine 0.96 - 0.45 mg/dL 4.09  eGFR >81 XB/JYN/8.29F6 84    Calcium 8.6 - 10.3 mg/dL 8.6    Latest Reference Range & Units 11/29/21 14:53 02/09/22 14:45  Hemoglobin A1C 4.0 - 5.6 % 7.5 ! 8.1 !      Assessment & Plan:   Type 1 diabetes  - Jamiyla Billie has currently uncontrolled symptomatic type 1 DM since  68 years of age,  with most recent A1c of 7 %.   She wears Medtronic MiniMed 630G insulin  pump.  She was given a revised insulin  pump settings during her last visit.  She made some significant changes to her settings including 7 different basal flow rates which led to uncontrolled nocturnal hyperglycemia and presents with A1c of 7.7% at point-of-care today.    Her AGP report shows average blood glucose of 170 mg per DL over the last 14 days,  59% time in range, 27% level 1 hyperglycemia, 12% level 2 hyperglycemia.  She  does have 2% level 1 hypoglycemia which seems to be significant improvement from her prior presentation of 11% hypoglycemia.    Her recent daily insulin  flow rate suggests 66% basal, 34% bolus flow rates.  Her basal flow rates are 12 AM 0.2 units, 2 AM 0.2 to 5 units, 4 AM 0.425 units, 5 AM 0.5 units, 7 AM 1.95 units, 11 AM 2.1 units, 8 PM 0.5 units. She consumes approximately 72 carbs daily.  Insulin  active time is 3 hours.   Recent labs reviewed. - I had a long discussion with her about the possible risk factors and  the pathology behind its diabetes and its complications. -her diabetes is complicated by coronary artery disease, retinopathy, comorbid hyperlipidemia/hypertension, hypothyroidism and she remains at a high risk for more acute and chronic complications which include CAD, CVA, CKD, retinopathy, and neuropathy. These are all discussed in detail with her.  - I discussed all available options of managing her diabetes including de-escalation of medications. I have counseled her on Food as Medicine by adopting a Whole Food , Plant Predominant  ( WFPP) nutrition as recommended by [employer] of Lifestyle Medicine. Patient is encouraged to switch to  unprocessed or minimally processed  complex starch, adequate protein intake (mainly plant source), minimal liquid fat, plenty of fruits, and vegetables. -  she is advised to stick to a routine mealtimes to eat 3 complete meals a day and snack only when necessary ( to snack only to correct hypoglycemia BG <70 day time or <100 at night).    - she acknowledges that there is a room for improvement in her food and drink choices. - Further Specific Suggestion is made for her to avoid simple carbohydrates  from her diet including Cakes, Sweet Desserts, Ice Cream, Soda (diet and regular), Sweet Tea, Candies, Chips, Cookies, Store Bought Juices, Alcohol ,  Artificial Sweeteners,  Coffee Creamer, and "Sugar-free" Products. This will help patient to have  more stable blood glucose profile and potentially avoid unintended weight gain.  - she will be scheduled with Penny Crumpton, RDN, CDE for individualized diabetes education.  - I have approached her with the following individualized plan to manage  her diabetes and patient agrees:   -For the 4+ decades of type 1 diabetes, she reports only 1 episode of diabetes ketoacidosis.  She wore insulin  pump for more than 10 years and she would like to keep on pump therapy.    -Priority will be to avoid hypoglycemia in this patient.  It seems that she has achieved this goal pretty well compared to her previous presentations.    -I discussed and worked with the patient to adjust her basal Humalog  flow via her Medtronic MiniMed 630G insulin  pump to 0.5 units/h 12:AM- 7 AM, keep at 1.95 until 11 AM, keep at 2.1 until 8 PM, keep at 0.5 from 8 PM to 12 AM .  This will give her 32.2 units of basal insulin  daily compared to 31.1 units currently.    She is allowed to continue her current bolus scheme of 1 unit for 10 g of carbs with only 3 meals daily- BKFAST 6-8 AM, Lunch 12-2 PM, and supper 5-7 PM.   She is advised to lower her carbs consumption to 40-60 per meal which would give her 4 - 6 units of prandial insulin  as bolus.  -Her insulin  sensitivity factor is 50, target blood glucose is 100-120 across  a day, insulin  active time is 3 hours.  These parameters are kept the same without change.  She is encouraged to continue to use her freestyle libre along with her insulin  pump.  - she is encouraged to call clinic for blood glucose levels less than 70 or above 200 mg /dl. -She will be kept on exclusive insulin  treatment via insulin  pump for now.  - Specific targets for  A1c;  LDL, HDL,  and Triglycerides were discussed with the patient.  2) Blood Pressure /Hypertension:  Her blood pressure is controlled to target.  She is currently grieving the death of her husband.    she is advised to continue her current  medications including losartan  25 mg p.o. daily with breakfast . 3) Lipids/Hyperlipidemia:   She does not have recent lipid panel to review.  She is advised to continue rosuvastatin 10 mg p.o. nightly.   She will be considered for fasting lipid panel on subsequent visits.  4) hypothyroidism: The circumstance of her diagnosis are not available to review.  Her previsit thyroid function tests are consistent with appropriate replacement.  She is advised to continue levothyroxine  175 mcg p.o. daily before breakfast.    - We discussed about the correct intake of her thyroid hormone, on empty stomach at fasting, with water, separated by at least 30 minutes from breakfast and other medications,  and separated by more than 4 hours from calcium, iron, multivitamins, acid reflux medications (PPIs). -Patient is made aware of the fact that thyroid hormone replacement is needed for life, dose to be adjusted by periodic monitoring of thyroid function tests.   5) obesity :  Body mass index is 29.39 kg/m.  -    she is  a candidate for some weight loss. I discussed with her the fact that loss of 5 - 10% of her  current body weight will have the most impact on her diabetes management.  The above detailed  ACLM recommendations for nutrition, exercise, sleep, social life, avoidance of risky substances, the need for restorative sleep   information will also detailed on discharge instructions.  6) Chronic Care/Health Maintenance:  -she  is on ACEI/ARB and Statin medications and  is encouraged to initiate and continue to follow up with Ophthalmology, Dentist,  Podiatrist at least yearly or according to recommendations, and advised to   stay away from smoking. I have recommended yearly flu vaccine and pneumonia vaccine at least every 5 years; moderate intensity exercise for up to 150 minutes weekly; and  sleep for 7- 9 hours a day.  She does have vitamin B-12 and vitamin D  deficiency, discussed and supplemented these vitamins  for her.  - she is  advised to maintain close follow up with Jacqualin Mate, DO for primary care needs, as well as her other providers for optimal and coordinated care.   I spent  42  minutes in the care of the patient today including review of labs from CMP, Lipids, Thyroid Function, Hematology (current and previous including abstractions from other facilities); face-to-face time discussing  her blood glucose readings/logs, discussing hypoglycemia and hyperglycemia episodes and symptoms, medications doses, her options of short and long term treatment based on the latest standards of care / guidelines;  discussion about incorporating lifestyle medicine;  and documenting the encounter. Risk reduction counseling performed per USPSTF guidelines to reduce  obesity and cardiovascular risk factors.     Please refer to Patient Instructions for Blood Glucose Monitoring and Insulin /Medications Dosing Guide"  in media tab  for additional information. Please  also refer to " Patient Self Inventory" in the Media  tab for reviewed elements of pertinent patient history.  Barbaraann Bookbinder participated in the discussions, expressed understanding, and voiced agreement with the above plans.  All questions were answered to her satisfaction. she is encouraged to contact clinic should she have any questions or concerns prior to her return visit.   Follow up plan: She will return in 4 months with repeat labs, meter, her pump for evaluation  Kalvin Orf, MD [facility] [address] [place], [place] [address] Phone: [phone]  Fax: [phone]    03/01/2024, 1:12 PM  This note was partially dictated with voice recognition software. Similar sounding words can be transcribed inadequately or may not  be corrected upon review.

## 2024-05-28 ENCOUNTER — Telehealth: Payer: Self-pay

## 2024-05-28 NOTE — Telephone Encounter (Signed)
 Left a message making pt aware we had received information request from [facility] regarding her insulin  pump. Requested information faxed to [facility].

## 2024-09-02 ENCOUNTER — Ambulatory Visit: Admitting: "Endocrinology

## 2024-09-18 LAB — COMPREHENSIVE METABOLIC PANEL WITH GFR
ALT: 14 IU/L (ref 0–32)
AST: 18 IU/L (ref 0–40)
Albumin: 4.1 g/dL (ref 3.9–4.9)
Alkaline Phosphatase: 124 IU/L (ref 49–135)
BUN/Creatinine Ratio: 18 (ref 12–28)
BUN: 17 mg/dL (ref 8–27)
Bilirubin Total: 0.5 mg/dL (ref 0.0–1.2)
CO2: 25 mmol/L (ref 20–29)
Calcium: 9.3 mg/dL (ref 8.7–10.3)
Chloride: 104 mmol/L (ref 96–106)
Creatinine, Ser: 0.92 mg/dL (ref 0.57–1.00)
Globulin, Total: 2.1 g/dL (ref 1.5–4.5)
Glucose: 72 mg/dL (ref 70–99)
Potassium: 3.4 mmol/L — ABNORMAL LOW (ref 3.5–5.2)
Sodium: 144 mmol/L (ref 134–144)
Total Protein: 6.2 g/dL (ref 6.0–8.5)
eGFR: 68 mL/min/1.73 (ref >59)

## 2024-09-18 LAB — LIPID PANEL
Chol/HDL Ratio: 2.4 ratio (ref 0.0–4.4)
Cholesterol, Total: 127 mg/dL (ref 100–199)
HDL: 53 mg/dL (ref >39)
LDL Chol Calc (NIH): 60 mg/dL (ref 0–99)
Triglycerides: 69 mg/dL (ref 0–149)
VLDL Cholesterol Cal: 14 mg/dL (ref 5–40)

## 2024-09-18 LAB — VITAMIN B12: Vitamin B-12: 556 pg/mL (ref 232–1245)

## 2024-09-18 LAB — TSH: TSH: 5.78 u[IU]/mL — ABNORMAL HIGH (ref 0.450–4.500)

## 2024-09-18 LAB — T4, FREE: Free T4: 1.84 ng/dL — ABNORMAL HIGH (ref 0.82–1.77)

## 2024-09-18 LAB — VITAMIN D 25 HYDROXY (VIT D DEFICIENCY, FRACTURES): Vit D, 25-Hydroxy: 41.5 ng/mL (ref 30.0–100.0)

## 2024-09-23 ENCOUNTER — Encounter: Payer: Self-pay | Admitting: "Endocrinology

## 2024-09-23 ENCOUNTER — Ambulatory Visit: Admitting: "Endocrinology

## 2024-09-23 VITALS — BP 116/66 | HR 68 | Ht 65.0 in | Wt 167.8 lb

## 2024-09-23 DIAGNOSIS — E039 Hypothyroidism, unspecified: Secondary | ICD-10-CM | POA: Diagnosis not present

## 2024-09-23 DIAGNOSIS — E782 Mixed hyperlipidemia: Secondary | ICD-10-CM

## 2024-09-23 DIAGNOSIS — Z794 Long term (current) use of insulin: Secondary | ICD-10-CM | POA: Diagnosis not present

## 2024-09-23 DIAGNOSIS — E10319 Type 1 diabetes mellitus with unspecified diabetic retinopathy without macular edema: Secondary | ICD-10-CM

## 2024-09-23 DIAGNOSIS — E538 Deficiency of other specified B group vitamins: Secondary | ICD-10-CM

## 2024-09-23 DIAGNOSIS — E559 Vitamin D deficiency, unspecified: Secondary | ICD-10-CM

## 2024-09-23 LAB — POCT GLYCOSYLATED HEMOGLOBIN (HGB A1C): HbA1c, POC (controlled diabetic range): 7.8 % — AB (ref 0.0–7.0)

## 2024-09-23 MED ORDER — FREESTYLE LIBRE 2 READER DEVI: 0 refills | Status: AC

## 2024-09-23 NOTE — Patient Instructions (Signed)

## 2024-09-23 NOTE — Progress Notes (Signed)
 09/23/2024, 2:17 PM  Endocrinology follow-up note   Subjective:    Patient ID: Kristy King, female    DOB: Jul 13, 1956.  Kristy King is being seen in follow-up after she was seen in consultation for management of currently uncontrolled symptomatic diabetes requested by  Joella Sieving, DO.   Past Medical History:  Diagnosis Date   Complication of anesthesia    Diabetes mellitus, type II (HCC)    Dyspnea    Family history of adverse reaction to anesthesia    History of kidney stones    Hypertension    Hypothyroidism    PONV (postoperative nausea and vomiting)     Past Surgical History:  Procedure Laterality Date   APPENDECTOMY N/A 2015   BACK SURGERY     BREAST ENHANCEMENT SURGERY  1990   BREAST SURGERY     CARDIAC SURGERY  2024   CESAREAN SECTION  1982   CHOLECYSTECTOMY N/A 2017   EXTRACORPOREAL SHOCK WAVE LITHOTRIPSY Left 06/24/2021   Procedure: EXTRACORPOREAL SHOCK WAVE LITHOTRIPSY (ESWL);  Surgeon: Matilda Senior, MD;  Location: [facility];  Service: Urology;  Laterality: Left;   EYE SURGERY      Social History   Socioeconomic History   Marital status: Unknown    Spouse name: Not on file   Number of children: Not on file   Years of education: Not on file   Highest education level: Not on file  Occupational History   Not on file  Tobacco Use   Smoking status: Former   Smokeless tobacco: Never  Substance and Sexual Activity   Alcohol use: No    Alcohol/week: 0.0 standard drinks of alcohol   Drug use: No   Sexual activity: Not on file  Other Topics Concern   Not on file  Social History Narrative   Not on file   Social Drivers of Health   Financial Resource Strain: Not on file  Food Insecurity: Low Risk  (06/06/2024)   Received from [facility]   Hunger Vital Sign    Within the past 12 months, the food you bought just didn't last and you didn't have money to get more. : Never true    Within the past 12 months,  you worried that your food would run out before you got money to buy more: Never true  Transportation Needs: No Transportation Needs (06/27/2023)   Received from [facility]    In the past 12 months, has lack of reliable transportation kept you from medical appointments, meetings, work or from getting things needed for daily living? : No  Physical Activity: Not on file  Stress: Not on file  Social Connections: Not on file    Family History  Problem Relation Age of Onset   Valvular heart disease Mother    Cancer Sister    Heart attack Paternal Grandfather    Diabetes Neg Hx     Outpatient Encounter Medications as of 09/23/2024  Medication Sig   Continuous Glucose Receiver (FREESTYLE LIBRE 2 READER) DEVI As directed   acetaminophen (TYLENOL) 500 MG tablet Take 1,000 mg by mouth every 6 (six) hours as needed.   amiodarone (PACERONE) 200 MG tablet Take 1 tablet by mouth daily.   aspirin EC 81 MG tablet Take 1 tablet by mouth daily.   busPIRone (BUSPAR) 5 MG tablet Take 5 mg by mouth 3 (three) times daily as needed.   Cholecalciferol (VITAMIN D3) 50 MCG (2000 UT) capsule Take  1 capsule (2,000 Units total) by mouth daily with lunch.   citalopram (CELEXA) 20 MG tablet Take 30 mg by mouth daily.   clopidogrel (PLAVIX) 75 MG tablet Take 1 tablet by mouth daily.   cyanocobalamin  (VITAMIN B12) 250 MCG tablet Take 1 tablet (250 mcg total) by mouth daily with lunch.   ezetimibe (ZETIA) 10 MG tablet Take 1 tablet by mouth at bedtime.   HUMALOG  100 UNIT/ML injection USE WITH INSULIN  PUMP FOR A TOTAL DAILY DOSE OF 60 UNITS PER DAY   levothyroxine  (SYNTHROID ) 175 MCG tablet Take 175 mcg by mouth daily before breakfast.   losartan  (COZAAR ) 25 MG tablet Take 1 tablet (25 mg total) by mouth daily.   melatonin 1 MG TABS tablet Take 5 mg by mouth at bedtime.   metoprolol  tartrate (LOPRESSOR ) 25 MG tablet Take 0.5 tablets by mouth 2 (two) times daily.   oxymetazoline (AFRIN) 0.05 % nasal  spray Place 1 spray into both nostrils 2 (two) times daily as needed for congestion.   polyethylene glycol (MIRALAX / GLYCOLAX) 17 g packet Take 17 g by mouth daily.   rosuvastatin (CRESTOR) 10 MG tablet Take 10 mg by mouth daily.   [DISCONTINUED] Continuous Glucose Sensor (FREESTYLE LIBRE 2 SENSOR) MISC USE TO MONITOR GLUCOSE CONTINUOUSLY AS DIRECTED   No facility-administered encounter medications on file as of 09/23/2024.    ALLERGIES: Allergies  Allergen Reactions   Epinephrine     tachycardia   Sulfa Antibiotics     VACCINATION STATUS: Immunization History  Administered Date(s) Administered   Moderna Sars-Covid-2 Vaccination 12/29/2019, 01/26/2020, 08/19/2020, 03/09/2021    Diabetes She presents for her follow-up diabetic visit. She has type 1 diabetes mellitus. Onset time: She was diagnosed at approximate age of 21 years. Her disease course has been fluctuating. Pertinent negatives for hypoglycemia include no confusion, headaches, hunger, pallor, seizures, sweats or tremors. Associated symptoms include visual change. Pertinent negatives for diabetes include no chest pain, no fatigue, no polydipsia, no polyphagia and no polyuria. There are no hypoglycemic complications. Symptoms are worsening. Diabetic complications include heart disease and retinopathy. Risk factors for coronary artery disease include diabetes mellitus, dyslipidemia, obesity and tobacco exposure. Current diabetic treatment includes insulin  pump. Her weight is decreasing steadily. She is following a generally unhealthy diet. When asked about meal planning, she reported none. Her home blood glucose trend is fluctuating minimally. Her overall blood glucose range is 140-180 mg/dl. (She wears Medtronic MiniMed 630G insulin  pump.  She has not been using her CGM saying that she could not charge the reader.  She is monitoring blood glucose 0-4 times daily, randomly.  She did not document any significant, persistent  hypoglycemia. Her basal flow rates are 12 AM 0.65units, 7 AM 2.15 units, 11AM 1.75 units, 3:30Pm 0.45, 8PM 0.675 units.  This gives her exactly  25.75 units of insulin  in 24 hours.  Her insulin  to carb ratio was 1:10 grams glucose target of 100-120 mg/day.  Her insulin  active time is 3 hours. Her point-of-care A1c is 7.8% today.)  Hyperlipidemia This is a chronic problem. The current episode started more than 1 year ago. Exacerbating diseases include diabetes, hypothyroidism and obesity. Pertinent negatives include no chest pain, myalgias or shortness of breath. Current antihyperlipidemic treatment includes statins. Risk factors for coronary artery disease include diabetes mellitus, dyslipidemia, hypertension, a sedentary lifestyle, post-menopausal and obesity.  Hypertension This is a chronic problem. Pertinent negatives include no chest pain, headaches, palpitations, shortness of breath or sweats. Risk factors for coronary artery disease  include dyslipidemia, obesity, diabetes mellitus, post-menopausal state, sedentary lifestyle and smoking/tobacco exposure. Past treatments include angiotensin blockers. Hypertensive end-organ damage includes CAD/MI and retinopathy.     Objective:       09/23/2024    1:04 PM 03/01/2024   10:03 AM 08/17/2023    2:39 PM  Vitals with BMI  Height 5' 5 5' 5 5' 5  Weight 167 lbs 13 oz 176 lbs 10 oz 182 lbs 13 oz  BMI 27.92 29.39 30.42  Systolic 116 136 895  Diastolic 66 64 56  Pulse 68 72 88    BP 116/66   Pulse 68   Ht 5' 5 (1.651 m)   Wt 167 lb 12.8 oz (76.1 kg)   BMI 27.92 kg/m   Wt Readings from Last 3 Encounters:  09/23/24 167 lb 12.8 oz (76.1 kg)  03/01/24 176 lb 9.6 oz (80.1 kg)  08/17/23 182 lb 12.8 oz (82.9 kg)     CMP ( most recent) CMP     Component Value Date/Time   NA 144 09/17/2024 1148   K 3.4 (L) 09/17/2024 1148   CL 104 09/17/2024 1148   CO2 25 09/17/2024 1148   GLUCOSE 72 09/17/2024 1148   BUN 17 09/17/2024 1148    CREATININE 0.92 09/17/2024 1148   CALCIUM 9.3 09/17/2024 1148   PROT 6.2 09/17/2024 1148   ALBUMIN 4.1 09/17/2024 1148   AST 18 09/17/2024 1148   ALT 14 09/17/2024 1148   ALKPHOS 124 09/17/2024 1148   BILITOT 0.5 09/17/2024 1148   EGFR 68 09/17/2024 1148     Diabetic Labs (most recent): Lab Results  Component Value Date   HGBA1C 7.8 (A) 09/23/2024   HGBA1C 7.7 (A) 03/01/2024   HGBA1C 8.1 (A) 02/09/2022        1 mo ago   Sodium 136 - 145 mmol/L 131 Low   Potassium 3.5 - 5.1 mmol/L 3.8  Comment: NO VISIBLE HEMOLYSIS  Chloride 98 - 107 mmol/L 95 Low   CO2 21 - 31 mmol/L 31  Anion Gap 6 - 14 mmol/L 5 Low   Glucose, Random 70 - 99 mg/dL 866 High   Blood Urea Nitrogen (BUN) 7 - 25 mg/dL 12  Creatinine 9.39 - 8.79 mg/dL 9.21  eGFR >40 fO/fpw/8.26f7 84    Calcium 8.6 - 10.3 mg/dL 8.6    Latest Reference Range & Units 11/29/21 14:53 02/09/22 14:45  Hemoglobin A1C 4.0 - 5.6 % 7.5 ! 8.1 !      Assessment & Plan:   Type 1 diabetes  - Kristy King has currently uncontrolled symptomatic type 1 DM since  68 years of age,  with most recent A1c of 7.8 %.   She wears Medtronic MiniMed 630G insulin  pump.  She has not been using her CGM saying that she could not charge the reader.  She is monitoring blood glucose 0-4 times daily, randomly.  She did not document any significant, persistent hypoglycemia. Her basal flow rates are 12 AM 0.65units, 7 AM 2.15 units, 11AM 1.75 units, 3:30Pm 0.45, 8PM 0.675 units.  This gives her exactly  25.75 units of insulin  in 24 hours.  Her insulin  to carb ratio was 1:10 grams glucose target of 100-120 mg/day.  Her insulin  active time is 3 hours. Her point-of-care A1c is 7.8% today.   Recent labs reviewed. - I had a long discussion with her about the possible risk factors and  the pathology behind its diabetes and its complications. -her diabetes is complicated by coronary artery  disease, retinopathy, comorbid hyperlipidemia/hypertension,  hypothyroidism and she remains at a high risk for more acute and chronic complications which include CAD, CVA, CKD, retinopathy, and neuropathy. These are all discussed in detail with her.  - I discussed all available options of managing her diabetes including de-escalation of medications.   Patient is encouraged to switch to  unprocessed or minimally processed  complex starch, adequate protein intake (mainly plant source), minimal liquid fat, plenty of fruits, and vegetables. -  she is advised to stick to a routine mealtimes to eat 3 complete meals a day and snack only when necessary ( to snack only to correct hypoglycemia BG <70 day time or <100 at night).   - she acknowledges that there is a room for improvement in her food and drink choices. - Suggestion is made for her to avoid simple carbohydrates  from her diet including Cakes, Sweet Desserts, Ice Cream, Soda (diet and regular), Sweet Tea, Candies, Chips, Cookies, Store Bought Juices, Alcohol , Artificial Sweeteners,  Coffee Creamer, and Sugar-free Products, Lemonade. This will help patient to have more stable blood glucose profile and potentially avoid unintended weight gain.  - I have approached her with the following individualized plan to manage  her diabetes and patient agrees:   -For the 4+ decades of type 1 diabetes, she reports only 1 episode of diabetes ketoacidosis.  She wore insulin  pump for more than 10 years and she would like to keep on pump therapy.    -Priority will be to avoid hypoglycemia in this patient.  It seems that she has achieved this goal pretty well compared to her previous presentations.    - I discussed and The insulin  pump settings the same as above.  I encouraged her to stick to a routine mealtime breakfast between 6 and 8 AM, lunch between 12 to 2 PM, and supper between 5 and 7 PM.  She will get 25.75 units of basal insulin  / 24 hours. She is advised to lower her carbs consumption to 40-60 per meal which  would give her 4 - 6 units of prandial insulin  as bolus. -I sent a new prescription for her freestyle libre 2 reader.  She is advised to use  her meter as a backup when she has problems with her CGM device. -Her insulin  sensitivity factor is 50, target blood glucose is 100-120 across a day, insulin  active time is 3 hours.  These parameters are kept the same without change.  She is encouraged to continue to use her freestyle libre along with her insulin  pump.  - she is encouraged to call clinic for blood glucose levels less than 70 or above 200 mg /dl. -She will be kept on exclusive insulin  treatment via insulin  pump for now.  - Specific targets for  A1c;  LDL, HDL,  and Triglycerides were discussed with the patient.  2) Blood Pressure /Hypertension:  Her blood pressure is controlled to target.  She is currently grieving the death of her husband.    she is advised to continue her current medications including losartan  25 mg p.o. daily with breakfast . 3) Lipids/Hyperlipidemia:   She does not have recent lipid panel to review.  She is advised to continue rosuvastatin 10 mg p.o. nightly.    She will be considered for fasting lipid panel on subsequent visits.  4) hypothyroidism: The circumstance of her diagnosis are not available to review.  Her previsit thyroid function tests are consistent with appropriate replacement.  She is advised to continue  levothyroxine  175 mcg p.o. daily before breakfast.    - We discussed about the correct intake of her thyroid hormone, on empty stomach at fasting, with water, separated by at least 30 minutes from breakfast and other medications,  and separated by more than 4 hours from calcium, iron, multivitamins, acid reflux medications (PPIs). -Patient is made aware of the fact that thyroid hormone replacement is needed for life, dose to be adjusted by periodic monitoring of thyroid function tests.   5) obesity :  Body mass index is 27.92 kg/m.  -    she presents  with progressive weight loss, not a candidate for major weight loss.   I discussed with her the fact that loss of 5 - 10% of her  current body weight will have the most impact on her diabetes management.  The above detailed  ACLM recommendations for nutrition, exercise, sleep, social life, avoidance of risky substances, the need for restorative sleep   information will also detailed on discharge instructions.  6) Chronic Care/Health Maintenance:  -she  is on ACEI/ARB and Statin medications and  is encouraged to initiate and continue to follow up with Ophthalmology, Dentist,  Podiatrist at least yearly or according to recommendations, and advised to   stay away from smoking. I have recommended yearly flu vaccine and pneumonia vaccine at least every 5 years; moderate intensity exercise for up to 150 minutes weekly; and  sleep for 7- 9 hours a day.  She does have vitamin B-12 and vitamin D  deficiency, discussed and supplemented these vitamins for her.  - she is  advised to maintain close follow up with Joella Sieving, DO for primary care needs, as well as her other providers for optimal and coordinated care.  I spent  28  minutes in the care of the patient today including review of labs from CMP, Lipids, Thyroid Function, Hematology (current and previous including abstractions from other facilities); face-to-face time discussing  her blood glucose readings/logs, discussing hypoglycemia and hyperglycemia episodes and symptoms, medications doses, her options of short and long term treatment based on the latest standards of care / guidelines;  discussion about incorporating lifestyle medicine;  and documenting the encounter. Risk reduction counseling performed per USPSTF guidelines to reduce cardiovascular risk factors.     Please refer to Patient Instructions for Blood Glucose Monitoring and Insulin /Medications Dosing Guide  in media tab for additional information. Please  also refer to  Patient Self  Inventory in the Media  tab for reviewed elements of pertinent patient history.  Kristy King participated in the discussions, expressed understanding, and voiced agreement with the above plans.  All questions were answered to her satisfaction. she is encouraged to contact clinic should she have any questions or concerns prior to her return visit.   Follow up plan: She will return in 4 months with repeat labs, meter, her pump for evaluation  Ranny Earl, MD [facility] [address] [place], [place] [address] Phone: [phone]  Fax: [phone]    09/23/2024, 2:17 PM  This note was partially dictated with voice recognition software. Similar sounding words can be transcribed inadequately or may not  be corrected upon review.

## 2024-10-09 ENCOUNTER — Telehealth: Payer: Self-pay | Admitting: "Endocrinology

## 2024-10-09 NOTE — Telephone Encounter (Signed)
 Pt said that the reader was sent in but not the sensors. Can you send that to [facility] in [place]

## 2024-10-10 ENCOUNTER — Other Ambulatory Visit: Payer: Self-pay | Admitting: *Deleted

## 2024-10-10 ENCOUNTER — Other Ambulatory Visit: Payer: Self-pay | Admitting: Nurse Practitioner

## 2024-10-10 DIAGNOSIS — E10319 Type 1 diabetes mellitus with unspecified diabetic retinopathy without macular edema: Secondary | ICD-10-CM

## 2024-10-10 DIAGNOSIS — E039 Hypothyroidism, unspecified: Secondary | ICD-10-CM

## 2024-10-10 DIAGNOSIS — E538 Deficiency of other specified B group vitamins: Secondary | ICD-10-CM

## 2024-10-10 DIAGNOSIS — E782 Mixed hyperlipidemia: Secondary | ICD-10-CM

## 2024-10-10 DIAGNOSIS — Z794 Long term (current) use of insulin: Secondary | ICD-10-CM

## 2024-10-10 DIAGNOSIS — E559 Vitamin D deficiency, unspecified: Secondary | ICD-10-CM

## 2024-10-10 MED ORDER — FREESTYLE LIBRE 3 PLUS SENSOR MISC: 6 refills | Status: AC

## 2024-10-10 NOTE — Telephone Encounter (Signed)
 Refill fro sensors have been sent to the patient's pharmacy.

## 2024-11-06 ENCOUNTER — Ambulatory Visit: Admitting: "Endocrinology

## 2024-12-24 ENCOUNTER — Ambulatory Visit: Admitting: "Endocrinology
